# Patient Record
Sex: Male | Born: 1935 | Race: White | Hispanic: No | Marital: Married | Smoking: Former smoker
Health system: Southern US, Community
[De-identification: ages and names within clinical notes are randomized; demographics above are authoritative.]

## PROBLEM LIST (undated history)

## (undated) DIAGNOSIS — Z9861 Coronary angioplasty status: Secondary | ICD-10-CM

## (undated) DIAGNOSIS — I10 Essential (primary) hypertension: Secondary | ICD-10-CM

## (undated) DIAGNOSIS — R55 Syncope and collapse: Secondary | ICD-10-CM

## (undated) DIAGNOSIS — E785 Hyperlipidemia, unspecified: Secondary | ICD-10-CM

## (undated) DIAGNOSIS — Z8719 Personal history of other diseases of the digestive system: Secondary | ICD-10-CM

## (undated) DIAGNOSIS — I2119 ST elevation (STEMI) myocardial infarction involving other coronary artery of inferior wall: Secondary | ICD-10-CM

## (undated) DIAGNOSIS — H8309 Labyrinthitis, unspecified ear: Secondary | ICD-10-CM

## (undated) HISTORY — DX: Labyrinthitis, unspecified ear: H83.09

## (undated) HISTORY — DX: Coronary angioplasty status: Z98.61

## (undated) HISTORY — PX: CORONARY ANGIOPLASTY: SHX604

## (undated) HISTORY — DX: Syncope and collapse: R55

## (undated) HISTORY — PX: OTHER SURGICAL HISTORY: SHX169

## (undated) HISTORY — DX: Hyperlipidemia, unspecified: E78.5

## (undated) HISTORY — DX: ST elevation (STEMI) myocardial infarction involving other coronary artery of inferior wall: I21.19

## (undated) HISTORY — DX: Essential (primary) hypertension: I10

## (undated) HISTORY — PX: CORONARY ARTERY BYPASS GRAFT: SHX141

## (undated) HISTORY — DX: Personal history of other diseases of the digestive system: Z87.19

## (undated) HISTORY — PX: CARDIAC CATHETERIZATION: SHX172

---

## 2001-05-21 ENCOUNTER — Inpatient Hospital Stay (HOSPITAL_COMMUNITY): Admission: EM | Admit: 2001-05-21 | Discharge: 2001-05-23 | Payer: Self-pay | Admitting: *Deleted

## 2001-07-18 ENCOUNTER — Encounter (HOSPITAL_COMMUNITY): Admission: RE | Admit: 2001-07-18 | Discharge: 2001-10-16 | Payer: Self-pay | Admitting: Cardiology

## 2001-12-01 ENCOUNTER — Ambulatory Visit (HOSPITAL_COMMUNITY): Admission: RE | Admit: 2001-12-01 | Discharge: 2001-12-02 | Payer: Self-pay | Admitting: Cardiology

## 2001-12-01 ENCOUNTER — Encounter: Payer: Self-pay | Admitting: Cardiology

## 2004-06-30 ENCOUNTER — Ambulatory Visit (HOSPITAL_COMMUNITY): Admission: RE | Admit: 2004-06-30 | Discharge: 2004-06-30 | Payer: Self-pay | Admitting: Internal Medicine

## 2004-09-08 ENCOUNTER — Ambulatory Visit: Payer: Self-pay | Admitting: Cardiology

## 2004-11-11 ENCOUNTER — Ambulatory Visit: Payer: Self-pay | Admitting: Cardiology

## 2005-04-29 ENCOUNTER — Ambulatory Visit: Payer: Self-pay | Admitting: Internal Medicine

## 2005-05-19 ENCOUNTER — Ambulatory Visit: Payer: Self-pay | Admitting: Cardiology

## 2005-05-26 ENCOUNTER — Ambulatory Visit: Payer: Self-pay

## 2006-05-19 ENCOUNTER — Ambulatory Visit: Payer: Self-pay | Admitting: Cardiology

## 2006-06-28 ENCOUNTER — Ambulatory Visit: Payer: Self-pay | Admitting: Internal Medicine

## 2006-07-05 ENCOUNTER — Ambulatory Visit: Payer: Self-pay | Admitting: Cardiology

## 2006-07-22 ENCOUNTER — Ambulatory Visit: Payer: Self-pay | Admitting: Internal Medicine

## 2006-08-25 ENCOUNTER — Ambulatory Visit: Payer: Self-pay | Admitting: Internal Medicine

## 2006-08-25 LAB — HM COLONOSCOPY

## 2007-05-18 ENCOUNTER — Ambulatory Visit: Payer: Self-pay | Admitting: Cardiology

## 2007-05-18 LAB — CONVERTED CEMR LAB
ALT: 15 units/L (ref 0–53)
AST: 22 units/L (ref 0–37)
Albumin: 4 g/dL (ref 3.5–5.2)
Alkaline Phosphatase: 47 units/L (ref 39–117)
BUN: 11 mg/dL (ref 6–23)
Bilirubin, Direct: 0.1 mg/dL (ref 0.0–0.3)
CO2: 32 meq/L (ref 19–32)
Calcium: 9.3 mg/dL (ref 8.4–10.5)
Chloride: 101 meq/L (ref 96–112)
Cholesterol: 125 mg/dL (ref 0–200)
Creatinine, Ser: 0.8 mg/dL (ref 0.4–1.5)
GFR calc Af Amer: 122 mL/min
GFR calc non Af Amer: 101 mL/min
Glucose, Bld: 88 mg/dL (ref 70–99)
HDL: 45.7 mg/dL (ref >39.0)
LDL Cholesterol: 68 mg/dL (ref 0–99)
Potassium: 4.3 meq/L (ref 3.5–5.1)
Sodium: 138 meq/L (ref 135–145)
Total Bilirubin: 0.6 mg/dL (ref 0.3–1.2)
Total CHOL/HDL Ratio: 2.7
Total Protein: 6.5 g/dL (ref 6.0–8.3)
Triglycerides: 57 mg/dL (ref 0–149)
VLDL: 11 mg/dL (ref 0–40)

## 2007-05-24 ENCOUNTER — Ambulatory Visit: Payer: Self-pay

## 2007-08-16 ENCOUNTER — Encounter: Payer: Self-pay | Admitting: *Deleted

## 2007-08-16 DIAGNOSIS — E785 Hyperlipidemia, unspecified: Secondary | ICD-10-CM | POA: Insufficient documentation

## 2007-08-16 DIAGNOSIS — Z8719 Personal history of other diseases of the digestive system: Secondary | ICD-10-CM | POA: Insufficient documentation

## 2007-08-16 DIAGNOSIS — Z951 Presence of aortocoronary bypass graft: Secondary | ICD-10-CM | POA: Insufficient documentation

## 2007-08-16 DIAGNOSIS — Z9861 Coronary angioplasty status: Secondary | ICD-10-CM

## 2007-08-16 DIAGNOSIS — I2510 Atherosclerotic heart disease of native coronary artery without angina pectoris: Secondary | ICD-10-CM | POA: Insufficient documentation

## 2007-08-16 DIAGNOSIS — I10 Essential (primary) hypertension: Secondary | ICD-10-CM | POA: Insufficient documentation

## 2007-08-16 HISTORY — DX: Coronary angioplasty status: Z98.61

## 2007-09-22 ENCOUNTER — Ambulatory Visit: Payer: Self-pay | Admitting: Internal Medicine

## 2007-09-22 LAB — CONVERTED CEMR LAB
Calcium: 9.5 mg/dL (ref 8.4–10.5)
Chloride: 96 meq/L (ref 96–112)
GFR calc non Af Amer: 101 mL/min
PSA: 1.21 ng/mL (ref 0.10–4.00)
Sodium: 131 meq/L — ABNORMAL LOW (ref 135–145)

## 2007-09-24 ENCOUNTER — Encounter: Payer: Self-pay | Admitting: Internal Medicine

## 2008-04-29 ENCOUNTER — Ambulatory Visit: Payer: Self-pay | Admitting: Cardiology

## 2008-04-29 LAB — CONVERTED CEMR LAB
ALT: 13 units/L (ref 0–53)
Alkaline Phosphatase: 42 units/L (ref 39–117)
Bilirubin, Direct: 0.1 mg/dL (ref 0.0–0.3)
CO2: 31 meq/L (ref 19–32)
Calcium: 9.1 mg/dL (ref 8.4–10.5)
Glucose, Bld: 86 mg/dL (ref 70–99)
HDL: 47.2 mg/dL (ref >39.0)
Sodium: 140 meq/L (ref 135–145)
Total Protein: 6.6 g/dL (ref 6.0–8.3)

## 2008-05-06 ENCOUNTER — Ambulatory Visit: Payer: Self-pay | Admitting: Cardiology

## 2008-05-15 ENCOUNTER — Encounter: Payer: Self-pay | Admitting: Internal Medicine

## 2008-05-15 ENCOUNTER — Ambulatory Visit: Payer: Self-pay

## 2008-09-23 ENCOUNTER — Ambulatory Visit: Payer: Self-pay | Admitting: Internal Medicine

## 2008-09-23 LAB — CONVERTED CEMR LAB
ALT: 18 units/L (ref 0–53)
AST: 24 units/L (ref 0–37)
Alkaline Phosphatase: 41 units/L (ref 39–117)
Bilirubin, Direct: 0.1 mg/dL (ref 0.0–0.3)
CO2: 32 meq/L (ref 19–32)
Chloride: 102 meq/L (ref 96–112)
Potassium: 4.6 meq/L (ref 3.5–5.1)
Sodium: 138 meq/L (ref 135–145)
Total Bilirubin: 0.8 mg/dL (ref 0.3–1.2)
Total CHOL/HDL Ratio: 2.4
Triglycerides: 88 mg/dL (ref 0–149)

## 2008-10-01 ENCOUNTER — Encounter: Payer: Self-pay | Admitting: Internal Medicine

## 2008-10-07 ENCOUNTER — Encounter: Payer: Self-pay | Admitting: Internal Medicine

## 2008-11-18 ENCOUNTER — Encounter: Payer: Self-pay | Admitting: Internal Medicine

## 2009-01-27 ENCOUNTER — Telehealth: Payer: Self-pay | Admitting: Internal Medicine

## 2009-01-30 ENCOUNTER — Ambulatory Visit: Payer: Self-pay | Admitting: Internal Medicine

## 2009-01-30 DIAGNOSIS — M25569 Pain in unspecified knee: Secondary | ICD-10-CM | POA: Insufficient documentation

## 2009-02-20 ENCOUNTER — Encounter: Payer: Self-pay | Admitting: Internal Medicine

## 2009-03-18 ENCOUNTER — Encounter (INDEPENDENT_AMBULATORY_CARE_PROVIDER_SITE_OTHER): Payer: Self-pay | Admitting: *Deleted

## 2009-03-27 ENCOUNTER — Encounter: Payer: Self-pay | Admitting: Internal Medicine

## 2009-04-04 ENCOUNTER — Telehealth: Payer: Self-pay | Admitting: Cardiology

## 2009-06-11 ENCOUNTER — Ambulatory Visit: Payer: Self-pay | Admitting: Cardiology

## 2009-06-11 DIAGNOSIS — I2119 ST elevation (STEMI) myocardial infarction involving other coronary artery of inferior wall: Secondary | ICD-10-CM

## 2009-06-11 HISTORY — DX: ST elevation (STEMI) myocardial infarction involving other coronary artery of inferior wall: I21.19

## 2009-10-21 ENCOUNTER — Ambulatory Visit: Payer: Self-pay | Admitting: Internal Medicine

## 2009-10-21 LAB — CONVERTED CEMR LAB
Albumin: 4.7 g/dL (ref 3.5–5.2)
Basophils Absolute: 0 10*3/uL (ref 0.0–0.1)
Basophils Relative: 1 % (ref 0.0–3.0)
CO2: 29 meq/L (ref 19–32)
GFR calc non Af Amer: 100.32 mL/min (ref >60)
Glucose, Bld: 90 mg/dL (ref 70–99)
HCT: 42.9 % (ref 39.0–52.0)
HDL: 52.5 mg/dL (ref >39.00)
Hemoglobin: 14.3 g/dL (ref 13.0–17.0)
Lymphs Abs: 0.9 10*3/uL (ref 0.7–4.0)
Monocytes Relative: 10.8 % (ref 3.0–12.0)
Neutro Abs: 2.6 10*3/uL (ref 1.4–7.7)
Potassium: 4.1 meq/L (ref 3.5–5.1)
RBC: 4.51 M/uL (ref 4.22–5.81)
RDW: 12.5 % (ref 11.5–14.6)
Sodium: 136 meq/L (ref 135–145)
Total Bilirubin: 0.9 mg/dL (ref 0.3–1.2)
Total CHOL/HDL Ratio: 3
VLDL: 19.6 mg/dL (ref 0.0–40.0)

## 2009-12-16 ENCOUNTER — Ambulatory Visit: Payer: Self-pay | Admitting: Internal Medicine

## 2009-12-16 DIAGNOSIS — H8309 Labyrinthitis, unspecified ear: Secondary | ICD-10-CM | POA: Insufficient documentation

## 2010-06-17 ENCOUNTER — Ambulatory Visit: Payer: Self-pay | Admitting: Cardiology

## 2010-06-24 ENCOUNTER — Telehealth (INDEPENDENT_AMBULATORY_CARE_PROVIDER_SITE_OTHER): Payer: Self-pay | Admitting: *Deleted

## 2010-06-25 ENCOUNTER — Encounter: Payer: Self-pay | Admitting: Cardiology

## 2010-06-25 ENCOUNTER — Encounter (HOSPITAL_COMMUNITY): Admission: RE | Admit: 2010-06-25 | Discharge: 2010-07-15 | Payer: Self-pay | Admitting: Cardiology

## 2010-06-25 ENCOUNTER — Ambulatory Visit: Payer: Self-pay

## 2010-06-25 ENCOUNTER — Ambulatory Visit: Payer: Self-pay | Admitting: Cardiology

## 2010-10-13 ENCOUNTER — Encounter: Payer: Self-pay | Admitting: Internal Medicine

## 2010-10-13 ENCOUNTER — Observation Stay (HOSPITAL_COMMUNITY)
Admission: EM | Admit: 2010-10-13 | Discharge: 2010-10-14 | Payer: Self-pay | Source: Home / Self Care | Attending: Internal Medicine | Admitting: Internal Medicine

## 2010-10-22 ENCOUNTER — Ambulatory Visit: Payer: Self-pay | Admitting: Internal Medicine

## 2010-10-22 ENCOUNTER — Encounter: Payer: Self-pay | Admitting: Internal Medicine

## 2010-10-22 DIAGNOSIS — R55 Syncope and collapse: Secondary | ICD-10-CM | POA: Insufficient documentation

## 2010-11-24 NOTE — Assessment & Plan Note (Signed)
Summary: dizziness per wife/cd   Vital Signs:  Patient profile:   77 year old male Height:      57 inches Weight:      164 pounds BMI:     35.62 O2 Sat:      98 % on Room air Temp:     97.8 degrees F oral Pulse rate:   58 / minute BP supine:   132 / 68  (right arm) BP sitting:   138 / 70  (right arm) BP standing:   134 / 68  (right arm) Cuff size:   large  Vitals Entered By: Brenton Grills (December 16, 2009 2:50 PM)  O2 Flow:  Room air CC: Pt presents with dizziness x 1 week. Pt feels dizzy when getting up after laying down./aj   Primary Care Provider:  Nthony Lefferts  CC:  Pt presents with dizziness x 1 week. Pt feels dizzy when getting up after laying down./aj.  History of Present Illness: presents for increased dizziness when he gets up. He can tell when he lays down if he will be dizzy on rising. He has not fallen. the dysequilibrium will last minutes up to 30 mintues. No room spining. No diploplia, slurred speech,  cognitive trouble, weakness or other neurologic sypmtoms.   Current Medications (verified): 1)  Aspirin 325 Mg  Tabs (Aspirin) .... Take One Tablet Once Daily 2)  Crestor 20 Mg  Tabs (Rosuvastatin Calcium) .... Take One Tablet Once Daily 3)  Fish Oil 1200 Mg  Caps (Omega-3 Fatty Acids) .... Take 1 Tablet By Mouth Once A Day 4)  Mavik 2 Mg  Tabs (Trandolapril) .... Take One Tablet Once Daily 5)  Nitroglycerin 0.4 Mg Subl (Nitroglycerin) .... Place 1 Tablet Under Tongue As Directed 6)  Glucosamine 500 Mg Caps (Glucosamine Sulfate) .... Once Daily  Allergies (verified): No Known Drug Allergies PMH-FH-SH reviewed-no changes except otherwise noted  Review of Systems  The patient denies anorexia, fever, decreased hearing, hoarseness, chest pain, dyspnea on exertion, peripheral edema, headaches, abdominal pain, hematochezia, muscle weakness, difficulty walking, unusual weight change, and enlarged lymph nodes.    Physical Exam  General:   Well-developed,well-nourished,in no acute distress; alert,appropriate and cooperative throughout examination Head:  normocephalic and atraumatic.   Eyes:  vision grossly intact, pupils equal, pupils round, corneas and lenses clear, and no injection.   Neurologic:  alert & oriented X3, cranial nerves II-XII intact, gait normal, normla tandem gait. finger-to-nose normal, heel-to-shin normal, and Romberg negative.     Impression & Recommendations:  Problem # 1:  LABRYNTHITIS (ICD-386.30) Intermittent mild symptoms with a negative neuro exam are c/w labyrinthitis.  Plan - meclizine for several days on schedule then as needed.   Complete Medication List: 1)  Aspirin 325 Mg Tabs (Aspirin) .... Take one tablet once daily 2)  Crestor 20 Mg Tabs (Rosuvastatin calcium) .... Take one tablet once daily 3)  Fish Oil 1200 Mg Caps (Omega-3 fatty acids) .... Take 1 tablet by mouth once a day 4)  Mavik 2 Mg Tabs (Trandolapril) .... Take one tablet once daily 5)  Nitroglycerin 0.4 Mg Subl (Nitroglycerin) .... Place 1 tablet under tongue as directed 6)  Glucosamine 500 Mg Caps (Glucosamine sulfate) .... Once daily 7)  Medi-meclizine 25 Mg Tabs (Meclizine hcl) .... 1/2 tablet three times a day for 3 days then as needed.  Patient Instructions: 1)  dysequilibrium - seems consistent with labyrinthitis. Neuro exam is normal. symptoms are mild. Plan - meclizine 12.5 mg every 6 hours for several  days then every 6 hours as needed. This should clear. Call for increasing problems; weakness; double vision or any cognitive change.  Prescriptions: MEDI-MECLIZINE 25 MG TABS (MECLIZINE HCL) 1/2 tablet three times a day for 3 days then as needed.  #30 x 12   Entered and Authorized by:   Jacques Navy MD   Signed by:   Jacques Navy MD on 12/16/2009   Method used:   Handwritten   RxID:   [id number]

## 2010-11-24 NOTE — Progress Notes (Signed)
Summary: Nuc Pre-Procedure  Phone Note Outgoing Call Call back at [facility] Phone [phone]   Call placed by: Antionette Char RN,  June 24, 2010 3:07 PM Call placed to: Patient Reason for Call: Confirm/change Appt Summary of Call: Left message with information on Myoview Information Sheet (see scanned document for details).     Nuclear Med Background Indications for Stress Test: Evaluation for Ischemia, Graft Patency, Stent Patency   History: Angioplasty, CABG, COPD, Myocardial Infarction, Myocardial Perfusion Study  History Comments: 09 MPS Normal EF, inferior scar     Nuclear Pre-Procedure Cardiac Risk Factors: CVA, Family History - CAD, History of Smoking, Hypertension, Lipids Height (in): 57

## 2010-11-24 NOTE — Assessment & Plan Note (Signed)
Summary: F1Y/ANAS   Visit Type:  1 yr f/u Primary Spencer Best:  Spencer Best  CC:  edema/ankles at times says when he is sitting around...Marland Kitchenotherwise no other complaints today..pt weight is down 9 lb since 11/2009.  History of Present Illness: Spencer Best returns today for evaluation and management of his coronary disease. 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 He is having no angina or ischemic symptoms. His last stress Myoview was 2 years ago as listed below.  He has not had use any nitroglycerin. He denies any palpitations, orthopnea, PND or edema. He is very compliant with his medications. Lipids were reviewed from December which are at goal.    Current Medications (verified): 1)  Aspirin 325 Mg  Tabs (Aspirin) .... Take One Tablet Once Daily 2)  Crestor 20 Mg  Tabs (Rosuvastatin Calcium) .... Take One Tablet Once Daily 3)  Fish Oil 1200 Mg  Caps (Omega-3 Fatty Acids) .... Take 1 Tablet By Mouth Once A Day 4)  Mavik 2 Mg  Tabs (Trandolapril) .... Take One Tablet Once Daily 5)  Nitroglycerin 0.4 Mg Subl (Nitroglycerin) .... Place 1 Tablet Under Tongue As Directed 6)  Glucosamine 500 Mg Caps (Glucosamine Sulfate) .... Once Daily 7)  Medi-Meclizine 25 Mg Tabs (Meclizine Hcl) .... 1/2 Tablet Three Times A Day For 3 Days Then As Needed.  Allergies (verified): No Known Drug Allergies  Past History:  Past Medical History: Last updated: 06/10/2009 CORONARY ARTERY DISEASE (ICD-414.00) PERCUTANEOUS TRANSLUMINAL CORONARY ANGIOPLASTY, HX OF (ICD-V45.82) CORONARY ARTERY BYPASS GRAFT, TWO VESSEL, HX OF (ICD-V45.81) HYPERTENSION (ICD-401.9) HYPERLIPIDEMIA (ICD-272.4) KNEE PAIN, BILATERAL (ICD-719.46) SPECIAL SCREENING MALIGNANT NEOPLASM OF PROSTATE (ICD-V76.44) * L INTERNAL MAMMARY ARTERY TO L ANTEROR,SAPHENOUS VEIN GRAFT HEMORRHOIDS, HX OF (ICD-V12.79)    Past Surgical History: Last updated: 09/30/07 * L INTERNAL MAMMARY ARTERY TO L ANTEROR,SAPHENOUS VEIN GRAFT PERCUTANEOUS  TRANSLUMINAL CORONARY ANGIOPLASTY, HX OF (ICD-V45.82) CORONARY ARTERY BYPASS GRAFT, TWO VESSEL, HX OF (ICD-V45.81) corrction of abdominal birth defect at age 66  Family History: Last updated: 09/30/2007 father-died 13, CAD, RA/OA, Parkinson's mother - died 40, arrythmia, diverticulitis 1 brother - died of cancer 55, lung cancer 2 sisters- oldest 78, next 48 on multiple medications. Neg-colon, prostate cancer; DM  Social History: Last updated: 2007/09/30 married 1955 1 son 20, 1 daughter 54, 3 grandchildren working full -time on the farm, keeping dogs boxer marriage in good health  Risk Factors: Exercise: yes (2007-09-30)  Risk Factors: Smoking Status: quit (08/16/2007)  Review of Systems       negative other than history of present illness  Vital Signs:  Patient profile:   77 year old male Height:      57 inches Weight:      155 pounds BMI:     33.66 Pulse rate:   55 / minute Pulse rhythm:   irregular BP sitting:   118 / 70  (left arm) Cuff size:   large  Vitals Entered By: Danielle Rankin, CMA (June 17, 2010 10:10 AM)  Physical Exam  General:  Well developed, well nourished, in no acute distress. Head:  normocephalic and atraumatic Eyes:  PERRLA/EOM intact; conjunctiva and lids normal. Neck:  Neck supple, no JVD. No masses, thyromegaly or abnormal cervical nodes. Chest Wall:  no deformities or breast masses noted Lungs:  Clear bilaterally to auscultation and percussion. Heart:  PMI nondisplaced, normal S1-S2, regular rate and rhythm, no gallop, carotid upstrokes equal bilaterally without bruits Abdomen:  Bowel sounds positive; abdomen soft and non-tender without  masses, organomegaly, or hernias noted. No hepatosplenomegaly. Msk:  Back normal, normal gait. Muscle strength and tone normal. Pulses:  pulses normal in all 4 extremities Extremities:  No clubbing or cyanosis. Neurologic:  Alert and oriented x 3. Skin:  Intact without lesions or rashes. Psych:   Normal affect.   EKG  Procedure date:  06/17/2010  Findings:      sinus bradycardia, otherwise normal EKG.  Impression & Recommendations:  Problem # 1:  CORONARY ARTERY BYPASS GRAFT, TWO VESSEL, HX OF (ICD-V45.81) Assessment Unchanged  He remains extremely active at age 58. It's been over 2 years since his last stress test. We'll arrange an exercise Myoview. If this is negative will see him back in a year  Orders: EKG w/ Interpretation (93000) Nuclear Stress Test (Nuc Stress Test)  Problem # 2:  CORONARY ARTERY DISEASE (ICD-414.00) Assessment: Unchanged  His updated medication list for this problem includes:    Aspirin 325 Mg Tabs (Aspirin) .Marland Kitchen... Take one tablet once daily    Mavik 2 Mg Tabs (Trandolapril) .Marland Kitchen... Take one tablet once daily    Nitroglycerin 0.4 Mg Subl (Nitroglycerin) .Marland Kitchen... Place 1 tablet under tongue as directed  Orders: EKG w/ Interpretation (93000)  Problem # 3:  MYOCARDIAL INFARCTION, INFERIOR WALL, SUBSEQUENT CARE (ICD-410.42) Assessment: Unchanged  His updated medication list for this problem includes:    Aspirin 325 Mg Tabs (Aspirin) .Marland Kitchen... Take one tablet once daily    Mavik 2 Mg Tabs (Trandolapril) .Marland Kitchen... Take one tablet once daily    Nitroglycerin 0.4 Mg Subl (Nitroglycerin) .Marland Kitchen... Place 1 tablet under tongue as directed  Problem # 4:  HYPERTENSION (ICD-401.9) Assessment: Improved  His updated medication list for this problem includes:    Aspirin 325 Mg Tabs (Aspirin) .Marland Kitchen... Take one tablet once daily    Mavik 2 Mg Tabs (Trandolapril) .Marland Kitchen... Take one tablet once daily  Problem # 5:  HYPERLIPIDEMIA (ICD-272.4) Assessment: Improved  His updated medication list for this problem includes:    Crestor 20 Mg Tabs (Rosuvastatin calcium) .Marland Kitchen... Take one tablet once daily  Clinical Reports Reviewed:  Nuclear Study:  05/15/2008:  Excerise capacity: Excellent exercise capacity  Blood Pressure response: Hypertensive blood pressure response  Clinical  symptoms: No chest pain or dyspnea  ECG impression: Positive. 63m ST depression in v4-v6 at peak. Resolved in recovery.  Overall impression: Low risk stress nuclear study. Fixed defect in inferior wall from base to mid-ventricle consistent with previous infarct. ECG positive at peak but no ischemia on perfusion images. Excellent exercise capacity.  Arvilla Meres, Best  Lipid Panel: 10/21/2009:  Chol:  141   HDL:  52.50   LDL:  69    96/29/5284:  Chol:  135   HDL:  55.8   LDL:  62    04/29/2008:  Chol:  127   HDL:  47.2   LDL:  63    05/18/2007:  Chol:  125   HDL:  45.7   LDL:  68     Cardiac Cath:  12/01/2001: Cardiac Cath Findings:  CONCLUSIONS: 1. Coronary artery disease, status post prior bypass surgery and status post    diaphragmatic myocardial infarction, treated with primary angioplasty of    the right coronary artery, July 2002. 2. Severe native vessel disease with total occlusion of the left anterior    descending artery, 70% narrowing in the intermediate and posterolateral    branch of the circumflex artery, and 80% in-stent re-stenosis in the    proximal right coronary artery. 3.  Patent vein graft to the diagonal branch of the left anterior descending    and patent left internal mammary artery graft to the left anterior    descending. 4. Good left ventricular function. 5. Successful Cutting Balloon angioplasty for in-stent re-stenosis in the    proximal right coronary artery with improvement in percent diameter    narrowing from 80% to less than 20%.  DISPOSITION: The patient was returned to the postanesthesia unit for further observation.  observation. Dictated by:   Everardo Beals Juanda Chance, M.D. [facility] Attending Physician:  Lenoria Farrier DD:  12/01/01 TD:  12/02/01 Job: [id number]  05/21/2001: Cardiac Cath Findings:  ejection fraction of 55%.  CONCLUSIONS: 1. Coronary artery disease status post coronary artery bypass graft surgery in    1994, with an  acute diaphragmatic wall infarction due to total occlusion of    the right coronary artery, old total occlusion of the left anterior    descending artery, 40% narrowing in the proximal circumflex artery with 70%    narrowing in a small intermediate branch, a patent vein graft to the    diagonal branch of the left anterior descending artery with 70% stenosis    distal to the insertion site, a patent left internal mammary artery graft    to the left anterior descending artery, and inferobasal wall akinesis. 2. Successful stent deployment in the proximal right coronary artery with    improvement in the percent of diameter narrowing from 100% to 10% and    improvement in the flow from TIMI-0 to TIMI-3 flow.  There was grade 3    myocardial brush at the end of the procedure and complete resolution of    the ST segments on the monitor leads.  DISPOSITION:  The patient had the onset of chest pain at 0800 and arrived in the emergency room at 0900.  The stent was deployed establishing reperfusion at 1040.  This gave a door to balloon time of 1 hour and 40 minutes, and a reperfusion time of 2 hours and 40 minutes.  The patient will be classified as low-risk and discharged will be targeted on day #2. DD:  05/21/01 TD:  05/21/01 Job: [id number]   Patient Instructions: 1)  Your physician recommends that you schedule a follow-up appointment in: 1 year with Dr. Daleen Squibb 2)  Your physician recommends that you continue on your current medications as directed. Please refer to the Current Medication list given to you today. 3)  Your physician has requested that you have an exercise stress myoview.  For further information please visit [email or web address]  Please follow instruction sheet, as given. Prescriptions: NITROGLYCERIN 0.4 MG SUBL (NITROGLYCERIN) Place 1 tablet under tongue as directed  #25 x 9   Entered by:   Lisabeth Devoid RN   Authorized by:   Gaylord Shih, Best, [facility]   Signed by:   Lisabeth Devoid  RN on 06/17/2010   Method used:   Electronically to        [facility] (retail)       [address]       [place], [place]  [address]       Ph: [phone] or [phone]       Fax: [phone]   RxID:   [id number] MAVIK 2 MG  TABS (TRANDOLAPRIL) Take one tablet once daily  #30 Each x 11   Entered by:   Lisabeth Devoid RN   Authorized  by:   Gaylord Shih, Best, [facility]   Signed by:   Lisabeth Devoid RN on 06/17/2010   Method used:   Electronically to        [facility] (retail)       [address]       [place], [place]  [address]       Ph: [phone] or [phone]       Fax: [phone]   RxID:   [id number] CRESTOR 20 MG  TABS (ROSUVASTATIN CALCIUM) Take one tablet once daily  #30 x 11   Entered by:   Lisabeth Devoid RN   Authorized by:   Gaylord Shih, Best, [facility]   Signed by:   Lisabeth Devoid RN on 06/17/2010   Method used:   [facility]ron[facility] to        [facility] (retail)       [address]       [place], [place]  [address]       Ph: [phone] or [phone]       Fax: [phone]   RxID:   [id number]

## 2010-11-24 NOTE — Assessment & Plan Note (Signed)
Summary: Cardiology Nuclear Testing  Nuclear Med Background Indications for Stress Test: Evaluation for Ischemia, Graft Patency, PTCA Patency   History: Angioplasty, CABG, COPD, Heart Catheterization, Myocardial Infarction, Myocardial Perfusion Study  History Comments: 09 MPS Normal EF, inferior scar   Symptoms Comments: No Cardiac Symptoms   Nuclear Pre-Procedure Cardiac Risk Factors: CVA, Family History - CAD, History of Smoking, Hypertension, Lipids Caffeine/Decaff Intake: None NPO After: 9:00 PM Lungs: clear IV 0.9% NS with Angio Cath: 20g     IV Site: R Antecubital IV Started by: Irean Hong, RN Chest Size (in) 40     Height (in): 67 Weight (lb): 154 BMI: 24.21  Nuclear Med Study 1 or 2 day study:  1 day     Stress Test Type:  Stress Reading MD:  Olga Millers, MD     Referring MD:  Valera Castle, MD Resting Radionuclide:  Technetium 60m Tetrofosmin     Resting Radionuclide Dose:  11 mCi  Stress Radionuclide:  Technetium 29m Tetrofosmin     Stress Radionuclide Dose:  32.3 mCi   Stress Protocol Exercise Time (min):  11:30 min     Max HR:  131 bpm     Predicted Max HR:  145 bpm  Max Systolic BP: 221 mm Hg     Percent Max HR:  90.34 %     METS: 13.4 Rate Pressure Product:  [address]    Stress Test Technologist:  Bonnita Levan, RN     Nuclear Technologist:  Doyne Keel, CNMT  Rest Procedure  Myocardial perfusion imaging was performed at rest 45 minutes following the intravenous administration of Technetium 40m Tetrofosmin.  Stress Procedure  The patient exercised for 11:30 minutes.  The patient stopped due to leg fatigue and denied any chest pain.  There were no significant ST-T wave changes. Rare PAC and PVC noted.  Technetium 79m Tetrofosmin was injected at peak exercise and myocardial perfusion imaging was performed after a brief delay.  QPS Raw Data Images:  Acquisition technically good; normal left ventricular size. Stress Images:  There is decreased uptake in the  inferior wall Rest Images:  There is decreased uptake in the inferior wall. Subtraction (SDS):  No evidence of ischemia. Transient Ischemic Dilatation:  0.98  (Normal <1.22)  Lung/Heart Ratio:  0.23  (Normal <0.45)  Quantitative Gated Spect Images QGS EDV:  107 ml QGS ESV:  50 ml QGS EF:  53 % QGS cine images:  Normal wall motion.   Overall Impression  Exercise Capacity: Excellent exercise capacity. BP Response: Hypertensive blood pressure response. Clinical Symptoms: No chest pain ECG Impression: No significant ST segment change suggestive of ischemia. Overall Impression: Abnormal stress nuclear study with prior inferior infarct; no ischemia; probable right ventricular enlargement.  Appended Document: Cardiology Nuclear Testing stable, no change in treatment  Appended Document: Cardiology Nuclear Testing [facility] Mylo Red RN  Appended Document: Cardiology Nuclear Testing Pt aware of results. Copy sent to patient. Mylo Red RN

## 2010-11-26 NOTE — Assessment & Plan Note (Signed)
Summary: YEARLY FU/ MEDICARE/ LABS LATER/NWS  #   Vital Signs:  Patient profile:   77 year old male Height:      67 inches Weight:      161 pounds BMI:     25.31 O2 Sat:      99 % on Room air Temp:     97.3 degrees F oral Pulse rate:   51 / minute BP sitting:   130 / 66  (left arm) Cuff size:   regular  Vitals Entered By: Bill Salinas CMA (October 22, 2010 1:40 PM)  O2 Flow:  Room air CC: yearly/ ab   Primary Care Provider:  Jacques Navy MD  CC:  yearly/ ab.  History of Present Illness: Patient presents for hospital follow-up: he was admitted Dec 20-21 for syncope. Hospital records, labs reviewed and called for 2 D echo results: His evaluation was negative: nl MRI, nl Tele except for bradycardia and 1st degree AVB, nl 2D echo except for grade diastolic dysfunction. Labs were normal including lipid panel. Final diagnosis was micturition syncope.  In the interval since d/c he has done well with no recurrent symptoms. He does have mild positional veritigol symptoms with major position change.  Preventive Screening-Counseling & Management  Alcohol-Tobacco     Alcohol drinks/day: 0     Smoking Status: quit  Caffeine-Diet-Exercise     Caffeine use/day: none     Does Patient Exercise: yes     Type of exercise: cardio     Exercise (avg: min/session): <30     Times/week: <3  Hep-HIV-STD-Contraception     Dental Visit-last 6 months yes     Sun Exposure-Excessive: no  Safety-Violence-Falls     Seat Belt Use: yes     Helmet Use: n/a     Firearms in the Home: firearms in the home     Smoke Detectors: yes     Violence in the Home: no risk noted     Sexual Abuse: no     Fall Risk: low fall risk      Blood Transfusions:  no.    Current Medications (verified): 1)  Aspirin 325 Mg  Tabs (Aspirin) .... Take One Tablet Once Daily 2)  Crestor 20 Mg  Tabs (Rosuvastatin Calcium) .... Take One Tablet Once Daily 3)  Fish Oil 1200 Mg  Caps (Omega-3 Fatty Acids) .... Take 1  Tablet By Mouth Once A Day 4)  Mavik 2 Mg  Tabs (Trandolapril) .... Take One Tablet Once Daily 5)  Nitroglycerin 0.4 Mg Subl (Nitroglycerin) .... Place 1 Tablet Under Tongue As Directed 6)  Glucosamine 500 Mg Caps (Glucosamine Sulfate) .... Once Daily 7)  Medi-Meclizine 25 Mg Tabs (Meclizine Hcl) .... 1/2 Tablet Three Times A Day For 3 Days Then As Needed.  Allergies (verified): No Known Drug Allergies  Past History:  Past Medical History: Last updated: 06/10/2009 CORONARY ARTERY DISEASE (ICD-414.00) PERCUTANEOUS TRANSLUMINAL CORONARY ANGIOPLASTY, HX OF (ICD-V45.82) CORONARY ARTERY BYPASS GRAFT, TWO VESSEL, HX OF (ICD-V45.81) HYPERTENSION (ICD-401.9) HYPERLIPIDEMIA (ICD-272.4) KNEE PAIN, BILATERAL (ICD-719.46) SPECIAL SCREENING MALIGNANT NEOPLASM OF PROSTATE (ICD-V76.44) * L INTERNAL MAMMARY ARTERY TO L ANTEROR,SAPHENOUS VEIN GRAFT HEMORRHOIDS, HX OF (ICD-V12.79)    Past Surgical History: Last updated: 2007/10/02 * L INTERNAL MAMMARY ARTERY TO L ANTEROR,SAPHENOUS VEIN GRAFT PERCUTANEOUS TRANSLUMINAL CORONARY ANGIOPLASTY, HX OF (ICD-V45.82) CORONARY ARTERY BYPASS GRAFT, TWO VESSEL, HX OF (ICD-V45.81) corrction of abdominal birth defect at age 89  Family History: Last updated: 10-02-07 father-died 64, CAD, RA/OA, Parkinson's mother - died 78,  arrythmia, diverticulitis 1 brother - died of cancer 30, lung cancer 2 sisters- oldest 58, next 4 on multiple medications. Neg-colon, prostate cancer; DM  Social History: Last updated: 09/22/2007 married 1955 1 son 82, 1 daughter 2, 3 grandchildren working full -time on the farm, keeping dogs boxer marriage in good health  Social History: Caffeine use/day:  none Dental Care w/in 6 mos.:  yes Sun Exposure-Excessive:  no Risk analyst Use:  yes Fall Risk:  low fall risk Blood Transfusions:  no  Review of Systems       The patient complains of syncope.  The patient denies anorexia, fever, weight loss, weight gain, vision  loss, decreased hearing, chest pain, dyspnea on exertion, prolonged cough, headaches, abdominal pain, severe indigestion/heartburn, incontinence, muscle weakness, difficulty walking, depression, and enlarged lymph nodes.    Physical Exam  General:  Well-developed,well-nourished,in no acute distress; alert,appropriate and cooperative throughout examination Eyes:  vision grossly intact, pupils equal, and pupils round.   Neck:  supple.   Lungs:  normal respiratory effort.   Heart:  regular rhythm, no murmur, no rub, no HJR, and bradycardia.   Neurologic:  alert & oriented X3 and gait normal.   Skin:  turgor normal and color normal.   Psych:  Oriented X3, memory intact for recent and remote, normally interactive, and good eye contact.     Impression & Recommendations:  Problem # 1:  SYNCOPE, VASOVAGAL (ICD-780.2) No recurrent symptoms. Reviewed all data. Explained to patient the mechanism of vaso-vagal/micturition syncope. No further w/u or treatment needed.  He has had mild positional vertigo. 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 No need for further evaluation or treatment.  Problem # 2:  CORONARY ARTERY DISEASE (ICD-414.00)  Very stable . At last admission cardiac enzymes cycled x 3 - normal. He does have bradycardia.  Plan - continue presdent meds.           12 lead EKG with sinus bradycardia and P-R interval by calipers and 196 msec by machine read: no AVB.  His updated medication list for this problem includes:    Aspirin 325 Mg Tabs (Aspirin) .Marland Kitchen... Take one tablet once daily    Mavik 2 Mg Tabs (Trandolapril) .Marland Kitchen... Take one tablet once daily    Nitroglycerin 0.4 Mg Subl (Nitroglycerin) .Marland Kitchen... Place 1 tablet under tongue as directed  Orders: EKG w/ Interpretation (93000)  Problem # 3:  HYPERTENSION (ICD-401.9)  His updated medication list for this problem includes:    Mavik 2 Mg Tabs (Trandolapril) .Marland Kitchen... Take one tablet once daily  BP today:  130/66 Prior BP: 118/70 (06/17/2010)  Recent hospital labs normal. Control is adequate.  Plan - continue present meds.  Problem # 4:  HYPERLIPIDEMIA (ICD-272.4) Patient had labs as inpatient with excellent control with LDL at goal and normal liver functions.  His updated medication list for this problem includes:    Crestor 20 Mg Tabs (Rosuvastatin calcium) .Marland Kitchen... Take one tablet once daily  Problem # 5:  Preventive Health Care (ICD-V70.0) Todays exam and recent hospital eval and labs normal. He is current with colorectal cancer screening with last colonoscopy  Nov '07 - due for follow up 2017. Immunizations: Tetnus Dec '10; Pneumonia vaccine Nov '08; shingles Dec '10; flu Nov '10 and probably in hospital Dec '11. 12 Lead EKG with sinus bradycardia, no evidence of AVB or ishcemia.  In summary - medically stable man with complete neuro eval and other labs in hospital dec '11. He will return for routine care  in 1 year or return otherwise as needed.   provided copy of hospital labs, d/c summary and EKG.  Complete Medication List: 1)  Aspirin 325 Mg Tabs (Aspirin) .... Take one tablet once daily 2)  Crestor 20 Mg Tabs (Rosuvastatin calcium) .... Take one tablet once daily 3)  Fish Oil 1200 Mg Caps (Omega-3 fatty acids) .... Take 1 tablet by mouth once a day 4)  Mavik 2 Mg Tabs (Trandolapril) .... Take one tablet once daily 5)  Nitroglycerin 0.4 Mg Subl (Nitroglycerin) .... Place 1 tablet under tongue as directed 6)  Glucosamine 500 Mg Caps (Glucosamine sulfate) .... Once daily 7)  Medi-meclizine 25 Mg Tabs (Meclizine hcl) .... 1/2 tablet three times a day for 3 days then as needed.   Orders Added: 1)  Est. Patient Level IV [91478] 2)  EKG w/ Interpretation [93000]

## 2011-01-04 LAB — URINE CULTURE
Colony Count: NO GROWTH
Culture  Setup Time: 201112201358

## 2011-01-04 LAB — CBC
Hemoglobin: 13.2 g/dL (ref 13.0–17.0)
MCH: 30.9 pg (ref 26.0–34.0)
MCHC: 33.9 g/dL (ref 30.0–36.0)
Platelets: 163 10*3/uL (ref 150–400)
RDW: 12.8 % (ref 11.5–15.5)
WBC: 5.9 10*3/uL (ref 4.0–10.5)

## 2011-01-04 LAB — DIFFERENTIAL
Basophils Relative: 1 % (ref 0–1)
Eosinophils Absolute: 0.2 10*3/uL (ref 0.0–0.7)
Monocytes Relative: 11 % (ref 3–12)
Neutrophils Relative %: 61 % (ref 43–77)

## 2011-01-04 LAB — URINALYSIS, ROUTINE W REFLEX MICROSCOPIC
Glucose, UA: NEGATIVE mg/dL
pH: 6.5 (ref 5.0–8.0)

## 2011-01-04 LAB — COMPREHENSIVE METABOLIC PANEL
ALT: 14 U/L (ref 0–53)
Albumin: 3.6 g/dL (ref 3.5–5.2)
Alkaline Phosphatase: 35 U/L — ABNORMAL LOW (ref 39–117)
GFR calc Af Amer: >60 mL/min (ref >60)
Potassium: 4.3 mEq/L (ref 3.5–5.1)
Sodium: 135 mEq/L (ref 135–145)
Total Protein: 6.2 g/dL (ref 6.0–8.3)

## 2011-01-04 LAB — CK TOTAL AND CKMB (NOT AT ARMC)
CK, MB: 1.3 ng/mL (ref 0.3–4.0)
Total CK: 86 U/L (ref 7–232)

## 2011-01-04 LAB — BASIC METABOLIC PANEL
BUN: 11 mg/dL (ref 6–23)
CO2: 30 mEq/L (ref 19–32)
Calcium: 9.1 mg/dL (ref 8.4–10.5)
Creatinine, Ser: 0.91 mg/dL (ref 0.4–1.5)
Glucose, Bld: 96 mg/dL (ref 70–99)
Sodium: 134 mEq/L — ABNORMAL LOW (ref 135–145)

## 2011-01-04 LAB — CARDIAC PANEL(CRET KIN+CKTOT+MB+TROPI)
CK, MB: 0.9 ng/mL (ref 0.3–4.0)
Relative Index: INVALID (ref 0.0–2.5)
Relative Index: INVALID (ref 0.0–2.5)
Total CK: 77 U/L (ref 7–232)
Troponin I: 0.01 ng/mL (ref 0.00–0.06)

## 2011-01-04 LAB — LIPID PANEL
HDL: 47 mg/dL (ref >39)
Total CHOL/HDL Ratio: 2.5 RATIO
VLDL: 11 mg/dL (ref 0–40)

## 2011-01-04 LAB — POCT CARDIAC MARKERS
CKMB, poc: <1 ng/mL — ABNORMAL LOW (ref 1.0–8.0)
Troponin i, poc: <0.05 ng/mL (ref 0.00–0.09)

## 2011-01-04 LAB — PROTIME-INR: INR: 1.02 (ref 0.00–1.49)

## 2011-03-09 NOTE — Assessment & Plan Note (Signed)
[facility]                            CARDIOLOGY OFFICE NOTE   NAME:Spencer Best, Spencer Best                       MRN:          [id number]  DATE:05/06/2008                            DOB:          [birth year]    Mr. Levitt returns today for further management of his coronary artery  disease and hyperlipidemia.   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  He is status post coronary artery bypass grafting  with a vein graft to a diagonal and left internal mammary graft to the  LAD in 1994 by Dr. Sheliah Plane.  His last catheterization was in  2004 which showed patent grafts, normal LV function.  He has also had  his right coronary artery stented.  He is having no symptoms of angina.  His last stress Myoview showed excellent exercise tolerance, EF of 60%,  some ST-segment changes laterally, some scar in the inferior basal wall  but no significant ischemia.   His hyperlipidemia has been treated aggressively with Crestor 20 mg a  day as well as flaxseed oil and fish oil.  His total cholesterol is 127,  triglycerides 84, HDL 47.2, and LDL 63.  LFTs were normal.   He denies any angina, ischemic symptoms, dyspnea on exertion, orthopnea,  or PND.   CURRENT MEDICATIONS:  1. Enteric-coated aspirin 325 mg a day.  2. Crestor 20 mg a day.  3. Fish oil.  4. Lisinopril 20 mg a day.  5. Flaxseed oil 1200 mg a day.   PHYSICAL EXAMINATION:  VITAL SIGNS:  His blood pressure today is 137/61,  his pulse is 65 and regular, and his weight is 158.  HEENT:  Unchanged.  NECK:  Carotids upstrokes are equal bilaterally without bruits.  No JVD.  Thyroid is not enlarged.  Trachea is midline.  LUNGS:  Clear to auscultation.  HEART:  Nondisplaced PMI.  He has a soft systolic murmur along left  sternal border.  He has an S4.  ABDOMINAL:  Soft, good bowel sounds.  No midline bruit.  No  hepatomegaly.  No organomegaly.  EXTREMITIES:   No cyanosis, clubbing, or edema.  Pulses are 2+/4+  bilaterally and symmetrical.  NEURO:  Grossly intact.  SKIN:  Unremarkable.   His electrocardiogram shows normal sinus rhythm with poor progression in  the anterior precordium which is stable.  He does not have any  suggestion of any inferior wall infarct on his EKG nor did I see any of  these changes on his catheterization in 2004.   I am delighted Mr. Geiman is doing so well.  With the age of his grafts,  I would like to repeat an exercise rest/stress Myoview.  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   Assuming this is stable, I have made no changes in his medical program.  I will see him back in a year.     Thomas C. Daleen Squibb, MD, [facility]  Electronically Signed    TCW/MedQ  DD: 05/06/2008  DT: 05/07/2008  Job #: [id number]

## 2011-03-09 NOTE — Assessment & Plan Note (Signed)
[facility]                            CARDIOLOGY OFFICE NOTE   NAME:Spencer Best, Spencer Best                       MRN:          [id number]  DATE:05/18/2007                            DOB:          1933/04/05    1. Spencer Best returns today for further management of his coronary      artery disease.  He had bypass surgery in 1994.  He is having no      symptoms of ischemia.  He had a negative stress Myoview in 2006.      He is due followup.  There was some trivial inferior wall ischemia.      We are treating him medically.  He has normal left ventricular      systolic function.  2. Hyperlipidemia.  Lipids were at goal last year. He is due followup.      He is now off Zetia and just taking Crestor.  3. Hypertension.   He denies any symptoms of ischemia or angina.  He seems to be doing well  in general.   MEDICATIONS:  1. Mavik 2 mg daily.  2. Fish oil 1200 mg b.i.d.  3. Crestor 20 mg daily.  4. Enteric coated aspirin 325 mg daily.   PHYSICAL EXAMINATION:  He is in no acute distress.  His blood pressure is 120/62, pulse 60 and regular.  Weight is 154.  HEENT:  Normocephalic.  Atraumatic.  PERRLA, extraocular movements  intact. Sclera clear.  Facial symmetry is normal.  Carotids are full.  There are no bruits.  Thyroid is not enlarged.  Trachea is midline.  LUNGS:  Clear.  HEART:  Reveals a nondisplaced PMI with normal S1, S2.  ABDOMEN:  Soft, good bowel sounds.  No midline bruit.  EXTREMITIES:  No cyanosis, clubbing or edema. Pulses are brisk.  NEURO:  Exam is intact.   EKG:  Showed sinus brady, otherwise normal.   ASSESSMENT:  [name] is doing well.  His grafts are now 75 years old.   PLAN:  1. Exercise rest stress Myoview.  2. Continue current medications.  3. Fasting lipids and comprehensive metabolic panel.   If things are stable, I will see him back in a year.     Thomas C. Daleen Squibb, MD, [facility]  Electronically Signed    TCW/MedQ  DD:  05/18/2007  DT: 05/18/2007  Job #: [id number]

## 2011-03-12 NOTE — Cardiovascular Report (Signed)
. [facility]  Patient:    Spencer Best, Spencer Best Visit Number: [id number] MRN: [id number]          Service Type: CAT Location: [id number] Attending Physician:  Lenoria Farrier Dictated by:   Everardo Beals Juanda Chance, M.D. [facility] Proc. Date: 12/01/01 Admit Date:  12/01/2001   CC:         Rosalyn Gess. Norins, M.D. [facility]  Maisie Fus C. Wall, M.D. [facility]  Cardiopulmonary Laboratory   Cardiac Catheterization  PROCEDURES PERFORMED: Cardiac catheterization and percutaneous coronary intervention.  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 He has had previous bypass surgery. Six months ago, he had a DMI due to total occlusion of his native right coronary artery which was treated with stenting. He had a six-month followup Cardiolite scan which suggested inferior ischemia, and was seen by Dr. Daleen Squibb and scheduled for evaluation with angiography.  DESCRIPTION OF PROCEDURE: The procedure was performed via the right femoral artery using an arterial sheath and 6 French preformed coronary catheters.  A front wall arterial puncture was performed and Omnipaque contrast was used. A LIMA catheter was used for injection of the LIMA graft. A JR4 catheter was used for injection of the vein graft to the diagonal branch and the LAD. After completion of the diagnostic studies, we made a decision to proceed with intervention on the right coronary artery.  The patient was given weight-adjusted heparin to prolong the ACT to greater than 200 seconds and was given double bolus of Integrilin and infusion. We used a JR4 7 [place] guiding catheter and a short luge wire. We crossed the lesion in the proximal right coronary artery with the wire without difficulty. We initially went in with a 3.25 Cutting Balloon and performed a total of four inflations up to 8 atmospheres for 35 seconds. We then upgraded to a 3.5  x 15 mm Cutting Balloon and performed a total of 5 inflations up to 11 atmospheres for 35 seconds. Repeat diagnostic studies were then performed through the guiding catheter. The patient tolerated the procedure well and left the laboratory in satisfactory condition.  RESULTS: The left main coronary artery: The left main coronary artery was free of significant disease.  Left anterior descending: The left anterior descending artery was completely occluded after the small diagonal branch.  Circumflex artery: The circumflex artery gave rise to an intermediate branch and two posterolateral branches. There was 70% narrowing in the intermediate branch and there was 70% narrowing in the opposite portion of the one of the posterolateral branches.  Right coronary artery: The right coronary was a large dominant vessel that gave rise to a conus branch, three right ventricular branches, a posterior descending branch and a posterolateral branch. There was 80% narrowing in the proximal vessel within the stent and at both edges of the stent.  The saphenous vein graft to the diagonal branch and LAD was patent and functioned normally.  The LIMA graft to the LAD was patent and functioned normally.  LEFT VENTRICULOGRAPHY: The left ventriculogram performed in the RAO projection showed showed good wall motion with no areas or hypokinesis.  Following Cutting Balloon angioplasty of the in-stent re-stenosis in the proximal right coronary artery, the stenosis improved from 80% to less than 20%. There was no dissection seen.  The aortic pressure is 143/59 with a mean of 86 and left ventricular pressure was 143/19.  CONCLUSIONS: 1. Coronary artery  disease, status post prior bypass surgery and status post    diaphragmatic myocardial infarction, treated with primary angioplasty of    the right coronary artery, July 2002. 2. Severe native vessel disease with total occlusion of the left anterior     descending artery, 70% narrowing in the intermediate and posterolateral    branch of the circumflex artery, and 80% in-stent re-stenosis in the    proximal right coronary artery. 3. Patent vein graft to the diagonal branch of the left anterior descending    and patent left internal mammary artery graft to the left anterior    descending. 4. Good left ventricular function. 5. Successful Cutting Balloon angioplasty for in-stent re-stenosis in the    proximal right coronary artery with improvement in percent diameter    narrowing from 80% to less than 20%.  DISPOSITION: The patient was returned to the postanesthesia unit for further observation.    observation. Dictated by:   Everardo Beals Juanda Chance, M.D. [facility] Attending Physician:  Lenoria Farrier DD:  12/01/01 TD:  12/02/01 Job: [id number]

## 2011-03-12 NOTE — Discharge Summary (Signed)
[place]  Patient:    Spencer Best, Spencer Best                       MRN: [id number] Adm. Date:  [id number] Disc. Date: [id number] Attending:  Junious Silk Dictator:   Abelino Derrick, P.A.C. [facility]                           Discharge Summary  DISCHARGE DIAGNOSIS: 1. Acute DMI treated with RCA stenting, no angiojet. 2. Coronary artery bypass grafting in 1994, patent grafts at time of    catheterization this admission with an ejection fraction of 55%. 3. Treated hyperlipidemia.  HOSPITAL COURSE:  The patient is a 77 year old male who had bypass surgery in 1994 and presented with an acute inferior MI.  He was taken emergently to the catheterization lab by Dr. Juanda Chance.  Catheterization revealed a total RCA that was dilated and stented.  The LIMA to the LAD was patent, the SVG to the diagonal was patent, there was a 70% narrowing distally in the diagonal.  EF was 55%.  Reperfusion time was 2 hours and 40 minutes.  The patient was monitorED in CCU.  He did have some bradycardia and some idioventricular and nonsustained ventricular tachycardia early on but this cleared up by the time he was discharged.  His blood pressure is somewhat borderline at 102 systolic and he was now put on an ACE inhibitor.  DISCHARGE MEDICATIONS: 1. Coated aspirin once a day. 2. Pravachol 40 mg a day. 3. Plavix 75 mg a day for 4 weeks. 4. Nitroglycerin sublingual p.r.n.  LABORATORY DATA:  Cholesterol 168, LDL 90, HDL 47.  Liver function shows sodium 136, potassium 4.3, BUN 7, creatinine 0.8, white count 7.1, hemoglobin 12.3, hematocrit 35.8, platelets 184, INR is 1.1.  CK is peaked at 799, 156 MBs.  Liver functions are normal.  Chest x-ray shows borderline cardiomegaly, no active disease.  EKG shows sinus rhythm and sinus bradycardia.  Inferior Qs.  DISPOSITION:  The patient is discharged in stable condition and will followup with Dr. Mariella Saa., June 06, 2001, at 3  p.m. DD:  05/23/01 TD:  05/23/01 Job: [id number]

## 2011-03-12 NOTE — Assessment & Plan Note (Signed)
[facility]                              CARDIOLOGY OFFICE NOTE   NAME:Best, Spencer ANGELINO                       MRN:          [id number]  DATE:05/19/2006                            DOB:          Jun 27, 1933   REASON FOR EVALUATION:  1.  Spencer Best returns today for further management of his coronary artery      disease.  He had coronary artery bypass surgery in 1994.  He is having      no symptoms of ischemia.  He had a negative stress Myoview last year      except for some minimal inferior wall ischemia.  He has normal left      ventricular systolic function.  2.  Hyperlipidemia.  3.  Hypertension.   He is due lipids and a comprehensive metabolic panel today.  He is fasting.   MEDICATIONS:  1.  Lisinopril 10 mg a day.  2.  Fish Oil 1200 mg p.o. b.i.d.  3.  Crestor 20 mg a day.  4.  Zetia 10 mg a day.  5.  Aspirin 325 a day.   EXAMINATION:  VITAL SIGNS:  His blood pressure is 130/70.  His pulse 52 and  regular.  His weight is 154.  SKIN:  Color is good.  GENERAL:  He is in no acute distress.  NECK:  Carotid upstrokes are equal bilaterally without bruits. No JVD.  Thyroid is not enlarged.  Trachea is midline.  LUNGS:  Clear.  HEART:  Reveals a soft S1, S2 without murmur, rub, or gallop.  ABDOMEN:  Soft, no midline bruits, no hepatomegaly. Bowel sounds are  present.  EXTREMITIES:  Without cyanosis, clubbing or edema. Pulses are brisk.   Spencer Best is doing remarkable well.  His EKG shows sinus bradycardia with  no ST segment changes.   PLAN:  1.  Check CMP and fasting lipids.  2.  Prescription for Zetia written to the [facility] in hopes      of getting some samples there.  3.  No changes in his medical program.  4.  I plan on seeing him back in a year.  At that time, he will need a      stress Myoview.                               Thomas C. Daleen Squibb, MD, [facility]  TCW/MedQ  DD:  05/19/2006  DT:  05/19/2006  Job #:  [id number]

## 2011-03-12 NOTE — Cardiovascular Report (Signed)
[place]. [facility]  Patient:    Spencer Best, Spencer Best                         MRN: [id number] Proc. Date: 05/21/01 Adm. Date:  05/21/01 Attending:  Everardo Beals. Juanda Chance, M.D. [facility] CC:         Rosalyn Gess. Norins, M.D. [facility]  Maisie Fus C. Daleen Squibb, M.D. [facility]  Arlys John S. Jens Som, M.D. [facility]  Cardiac Catheterization Laboratory   Cardiac Catheterization  CLINICAL HISTORY:  [name] is 77 years old and had bypass surgery performed in 1994, by Dr. Tyrone Sage.  We do not have the records, but we believe he had a vein graft to the diagonal branch of the LAD and a LIMA to the LAD.  He developed the onset of chest pain at 8:00 this morning and presented to the emergency room at 9 a.m. with EKG changes and acute diaphragmatic and posterior wall infarction.  He was seen by Dr. Jens Som and arrangements were made for him to come emergently to the catheterization laboratory.  PROCEDURE:  The procedure was performed via the right femoral artery using arterial sheath and 6-French preformed coronary catheters.  A front wall arterial puncture was performed and Omnipaque contrast was used.  A temporary pacemaker was passed via the right femoral vein to the right ventricle because of marked sinus bradycardia at 40.  After completion of the diagnostic study, we made the decision to proceed with intervention on the right coronary artery.  The patient had been consented and enrolled in the AIMI trial and was randomized to no angiojet.  He was given weight-adjusted heparin to prolong the ACT to greater than 200 seconds and was given double bolus Integrilin and infusion.  All of this was not started until after the stent deployment.  We used a 7-French JR-4 guiding catheter with side-holes and a luge wire.  We crossed the lesion with the wire without difficulty, which established reperfusion.  We then direct stented with a 3.0 x 18 mm NIR stent, deploying this with one inflation of 12 atm for 30  seconds.  We then postdilated with a 3.5 x 12 mm Quantum Ranger, performing one inflation of 14 atm for 46 seconds. Repeat diagnostic study was then performed through the guiding catheter.  We injected the LIMA graft with a LIMA catheter and this was done after the intervention.  We used a JR-4 catheter for injection of the vein graft.  RESULTS:  The aortic pressure was 125/62, with a mean of 87.  The left ventricular pressure was 125/15.  The left main coronary artery was free of significant disease.  The left anterior descending artery was completely occluded in its origin.  The circumflex artery gave rise to a small intermediate branch, an atrial branch, and two posterolateral branches.  There was 40% ostial narrowing in the circumflex artery.  There was 70% ostial narrowing in the intermediate branch.  There was 40% narrowing in the distal circumflex artery.  The right coronary artery was completely occluded proximally.  There were no collaterals seen.  The saphenous vein graft to the diagonal branch of the LAD was patent and functioned normally and also filled the LAD, although there was competing flow from the mammary graft in the LAD.  There was 70% narrowing in the diagonal branch after the insertion site.  The LIMA graft to the LAD was patent and functioned normally.  This was done after the intervention.  There was  an 80% proximal to the LIMA insertion site. There was balanced flow in the LIMA.  Following stenting of the proximal right coronary artery, the stenosis improved from 100% to 10% and the flow improved from TIMI-0 to TIMI-3 flow. There was grade 3 myocardial brush at the end of the procedure.  The left ventriculogram performed in the RAO projection showed inferior basal wall akinesis.  The overall wall motion was good with an estimated ejection fraction of 55%.  CONCLUSIONS: 1. Coronary artery disease status post coronary artery bypass graft surgery in     1994, with an acute diaphragmatic wall infarction due to total occlusion of    the right coronary artery, old total occlusion of the left anterior    descending artery, 40% narrowing in the proximal circumflex artery with 70%    narrowing in a small intermediate branch, a patent vein graft to the    diagonal branch of the left anterior descending artery with 70% stenosis    distal to the insertion site, a patent left internal mammary artery graft    to the left anterior descending artery, and inferobasal wall akinesis. 2. Successful stent deployment in the proximal right coronary artery with    improvement in the percent of diameter narrowing from 100% to 10% and    improvement in the flow from TIMI-0 to TIMI-3 flow.  There was grade 3    myocardial brush at the end of the procedure and complete resolution of    the ST segments on the monitor leads.  DISPOSITION:  The patient had the onset of chest pain at 0800 and arrived in the emergency room at 0900.  The stent was deployed establishing reperfusion at 1040.  This gave a door to balloon time of 1 hour and 40 minutes, and a reperfusion time of 2 hours and 40 minutes.  The patient will be classified as low-risk and discharged will be targeted on day #2. DD:  05/21/01 TD:  05/21/01 Job: [id number]

## 2011-03-12 NOTE — Discharge Summary (Signed)
[place]. [facility]  Patient:    Spencer Best, Spencer Best Visit Number: [id number] MRN: [id number]          Service Type: CAT Location: [id number] Attending Physician:  Lenoria Farrier Dictated by:   Tereso Newcomer, P.A. Admit Date:  12/01/2001 Disc. Date: 12/02/01   CC:         Rosalyn Gess. Norins, M.D. [facility]   Discharge Summary  DATE OF BIRTH:  03/24/1933  DISCHARGE DIAGNOSES: 1. Coronary artery disease, status post coronary artery bypass graft. 2. Cardiac catheterization this admission revealing left anterior descending    total, circumflex 70% intermediate, 70% posterolateral, right coronary    artery 80% in-stent restenosis, saphenous vein graft to diagonal okay, left    internal mammary artery to left anterior descending okay. 3. Percutaneous transluminal coronary angioplasty to the mid right coronary    artery in-stent restenosis this admission with reduction in stenosis of 80%    to less than 20% by Dr. Charlies Constable on December 01, 2001. 4. Preserved left ventricular function with ejection fraction of 60%. 5. Hyperlipidemia.  HISTORY OF PRESENT ILLNESS:  This 77 year old male was seen in followup on November 21, 2001.  At that time, he denied any further angina.  He did have a followup stress Cardiolite on November 09, 2001, that showed an EF of 50% and mild inferior basilar ischemia.  Therefore, he was set up for cardiac catheterization to evaluate his abnormal Cardiolite.  HOSPITAL COURSE:  He went to [facility] area on December 01, 2001.  He underwent cardiac catheterization by Dr. Charlies Constable.  The results are noted above.  He further underwent percutaneous transluminal coronary angioplasty to the mid RCA and in-stent restenosis with reduction of stenosis of 80% to less than 20%.  He tolerated the procedure well and had no immediate complications.  On the morning of December 02, 2001, he was found to be in stable  condition without chest pain or right groin pain.  On exam, he did have a scant ooze from his groin site, but no hematoma or bruit.  His postprocedure CK-MB was negative.  His EKG was without significant change.  Therefore, he was felt stable enough for discharge to home.  He would continue on his same medications and followup with Dr. Daleen Squibb in one to two weeks.  LABORATORY DATA AND X-RAY FINDINGS:  Sodium 134, potassium 4.5, chloride 100, CO2 29, glucose 94, BUN 8, creatinine 0.8.  Hemoglobin 13, hematocrit 37, platelet count 174,000, white blood cell count 5400.  INR 1.2.  DISCHARGE MEDICATIONS: 1. Coated aspirin 325 mg q.d. 2. Pravachol 40 mg q.h.s. 3. Mavik 2 mg q.d. 4. Foltx q.d. 5. Colace p.r.n. 6. Nitroglycerin p.r.n. chest pain.  ACTIVITY:  No driving, heavy lifting or exertional work x3 days.  DIET:  Low fat, low sodium diet.  SPECIAL INSTRUCTIONS:  He is to call our office for any groin swelling, bleeding or bruising.  FOLLOWUP:  He will follow up with Dr. Daleen Squibb on February 17, at 10 a.m. Dictated by:   Tereso Newcomer, P.A. Attending Physician:  Lenoria Farrier DD:  12/02/01 TD:  12/02/01 Job: [id number]

## 2011-03-12 NOTE — Assessment & Plan Note (Signed)
[facility]                             PRIMARY CARE OFFICE NOTE   NAME:Spencer Best, Spencer Best                       MRN:          [id number]  DATE:06/28/2006                            DOB:          05/04/1933    Spencer Best is a delightful 77 year old gentleman who is followed for  multiple medical problems including known CAD, status post bypass surgery in  1994 with LIMA __________, SVG to diagonal.  The patient had an MI in July  of 2002 and had PTCA of the RCA, and he had InStent restenosis requiring a  PTCA of the RCA in February of 2003.  He is also followed for hemorrhoids  and hypertension.   The patient last saw cardiology on May 19, 2006, and Dr. Daleen Best felt at that  time that he was very stable and doing well.  I last saw the patient in July  of 2006.   PAST MEDICAL HISTORY:  As above.  In addition, the patient had removal of a  tumor from his abdomen at age 32.   MEDICAL ILLNESSES:  As above, with the addition of usual childhood diseases.   SOCIAL HISTORY:  The patient is retired and lives in [place].  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  Remains very active.   CURRENT MEDICATIONS:  1. Aspirin 325 mg daily.  2. Crestor 20 mg daily.  3. Fish oil daily.  4. Lisinopril 10 mg daily.  5. Mavik 2 mg daily.  6. Flax oil daily.   CHART REVIEW:  Last colorectal exam November 23, 2000 with flexible  sigmoidoscopy.  Last stress Myoview study is from May 26, 2005.  I  reviewed old inferobasal Best infarction, inferior Best ischemia at the apex  and mid level, gaited ejection fraction of 55%; this was thought to be  stable and is being followed conservatively.   REVIEW OF SYSTEMS:  The patient has had no fevers, sweats, or chills.  He  does have mild sleep duration problems.  It has been more than 12 months  since an eye exam.  No ENT, cardiovascular or respiratory complaints.  GI:  The patient does use a __________ with good  results.  GU:  The patient has  nocturia x2.  MUSCULOSKELETAL:  The patient's right knee gives him some pain  in the medial and infrapatellar regions.  No dermatologic or neurologic  complaints.   PHYSICAL EXAMINATION:  VITAL SIGNS:  Temperature was 96.8, blood pressure  125/62, pulse 53, weight 151.  GENERAL APPEARANCE:  A slender gentleman looking younger than his stated  chronologic age, in no acute distress.  HEENT:  Normocephalic and atraumatic.  EAC's and TM's were unremarkable.  Oropharynx with native dentition in good repair.  No buccal or palatal  lesions were noted.  Posterior pharynx was clear.  Conjunctivae and sclerae  were clear.  Pupils equal, round and reactive to light and accommodation.  Extraocular movements intact.  Funduscopic exam was unremarkable.  NECK:  Supple without thyromegaly.  NODES:  No adenopathy was noted in the cervical or supraclavicular  regions.  CHEST:  No CVA tenderness.  The patient has a well-healed sternotomy scar.  LUNGS:  Clear with no rales, wheezes or rhonchi.  CARDIOVASCULAR:  There were 2+ radial pulses.  No JVD or carotid bruits.  He  had a quiet precordium with regular rate and rhythm without murmurs, rubs,  or gallops.  ABDOMEN:  The patient has a well-healed right upper quadrant surgical scar.  He had no organosplenomegaly, no guarding, no tenderness, no rebound.  RECTAL:  Normal sphincter tone was noted.  The prostate was smooth, round,  normal in size and contour without nodules with well preserved sulcus.  EXTREMITIES:  Without clubbing, cyanosis, or edema.  No deformities are  noted.   LABORATORY DATA:  Labs from May 19, 2006 with normal metabolic panel with a  glucose of 91, normal liver functions.  Cholesterol was 93, triglycerides  66, HDL 39.6, LDL was 40.  The last PSA on May 19, 2005 was 1.61 with a  normal examination today.   ASSESSMENT AND PLAN:  1. Cardiovascular.  The patient is followed on a regular basis by Dr.  Juanito Best and currently is stable and doing well.  2. Lipids.  The patient's lipid control is excellent.  He will continue on      Crestor 20 mg daily.  He is to have followup laboratory per Dr. Daleen Best.  3. Hypertension.  The patient's blood pressure is very well controlled on      his present medical regimen, and he will continue the same.  4. Health maintenance.  The patient is due for colorectal cancer      screening, and will be referred for full colonoscopy.   In summary, this is a pleasant gentleman who seems medically stable at his  time.  He will return to see me on a p.r.n. basis.                                   Spencer Gess Norins, MD   MEN/MedQ  DD:  06/28/2006  DT:  06/28/2006  Job #:  [id number]   cc:   Spencer Cal Wall, MD, [facility]

## 2011-05-26 ENCOUNTER — Encounter: Payer: Self-pay | Admitting: Cardiology

## 2011-06-22 ENCOUNTER — Other Ambulatory Visit: Payer: Self-pay | Admitting: Cardiology

## 2011-06-25 ENCOUNTER — Encounter: Payer: Self-pay | Admitting: Cardiology

## 2011-06-25 ENCOUNTER — Ambulatory Visit (INDEPENDENT_AMBULATORY_CARE_PROVIDER_SITE_OTHER): Payer: Medicare Other | Admitting: Cardiology

## 2011-06-25 VITALS — BP 120/56 | HR 56 | Ht 67.0 in | Wt 154.8 lb

## 2011-06-25 DIAGNOSIS — E785 Hyperlipidemia, unspecified: Secondary | ICD-10-CM

## 2011-06-25 DIAGNOSIS — I10 Essential (primary) hypertension: Secondary | ICD-10-CM

## 2011-06-25 DIAGNOSIS — I2510 Atherosclerotic heart disease of native coronary artery without angina pectoris: Secondary | ICD-10-CM

## 2011-06-25 DIAGNOSIS — I2119 ST elevation (STEMI) myocardial infarction involving other coronary artery of inferior wall: Secondary | ICD-10-CM

## 2011-06-25 NOTE — Progress Notes (Signed)
HPI Spencer Best comes in today for followup of his coronary artery disease and history of iinferior wall infarction followed by bypass surgery. He has done remarkably well. He is very compliant with his medications. He has never taken several nitroglycerin.  Laboratory data from the [place] reviewed. Lipids are at goal is a very high HDL. His blood pressures but under good control.  EKG shows sinus bradycardia, otherwise normal EKG. Past Medical History  Diagnosis Date  . CAD (coronary artery disease)   . Post PTCA   . S/P CABG (coronary artery bypass graft)   . HTN (hypertension)   . Hyperlipidemia   . Knee pain, bilateral   . Hemorrhoids   . Hx of colonoscopy     Past Surgical History  Procedure Date  . Coronary angioplasty with stent placement   . Coronary artery bypass graft   . Correction of abdominal birth defect     at age 51  . L internal mammary artery to l anterior, saphenous vein graft     Family History  Problem Relation Age of Onset  . Coronary artery disease Father   . Osteoarthritis Father   . Diverticulitis Mother   . Lung cancer Brother   . Colon cancer Neg Hx   . Prostate cancer Neg Hx   . Diabetes Neg Hx     History   Social History  . Marital Status: Married    Spouse Name: N/A    Number of Children: 2  . Years of Education: N/A   Occupational History  . Full time on farm    Social History Main Topics  . Smoking status: Former Smoker    Quit date: 06/25/1971  . Smokeless tobacco: Not on file  . Alcohol Use: Not on file  . Drug Use: Not on file  . Sexually Active: Not on file   Other Topics Concern  . Not on file   Social History Narrative  . No narrative on file    No Known Allergies  Current Outpatient Prescriptions  Medication Sig Dispense Refill  . aspirin 325 MG tablet Take 325 mg by mouth daily.        . CRESTOR 20 MG tablet TAKE ONE TABLET BY MOUTH EVERY DAY  30 each  6  . Glucosamine 500 MG CAPS Take 1 capsule by mouth daily.         . nitroGLYCERIN (NITROSTAT) 0.4 MG SL tablet Place 0.4 mg under the tongue every 5 (five) minutes as needed.        . Omega-3 Fatty Acids (FISH OIL) 1200 MG CAPS Take 1 capsule by mouth daily.        . trandolapril (MAVIK) 2 MG tablet TAKE ONE TABLET BY MOUTH EVERY DAY  30 tablet  6    ROS Negative other than HPI.   PE   General Appearance: well developed, well nourished in no acute distress HEENT: symmetrical face, PERRLA, good dentition  Neck: no JVD, thyromegaly, or adenopathy, trachea midline Chest: symmetric without deformity Cardiac: PMI non-displaced, RRR, normal S1, S2, no gallop or murmur Lung: clear to ausculation and percussion Vascular: all pulses full without bruits  Abdominal: nondistended, nontender, good bowel sounds, no HSM, no bruits Extremities: no cyanosis, clubbing or edema, no sign of DVT, no varicosities  Skin: normal color, no rashes Neuro: alert and oriented x 3, non-focal Pysch: normal affect Filed Vitals:   06/25/11 1011  BP: 120/56  Pulse: 56  Height: 5\' 7"  (1.702 m)  Weight:  154 lb 12.8 oz (70.217 kg)    EKG  Labs and Studies Reviewed.   Lab Results  Component Value Date   WBC 5.9 10/14/2010   HGB 12.7* 10/14/2010   HCT 37.8* 10/14/2010   MCV 91.1 10/14/2010   PLT 163 10/14/2010      Chemistry      Component Value Date/Time   NA 135 10/14/2010 0414   K 4.3 10/14/2010 0414   CL 103 10/14/2010 0414   CO2 27 10/14/2010 0414   BUN 11 10/14/2010 0414   CREATININE 0.92 10/14/2010 0414      Component Value Date/Time   CALCIUM 8.8 10/14/2010 0414   ALKPHOS 35* 10/14/2010 0414   AST 20 10/14/2010 0414   ALT 14 10/14/2010 0414   BILITOT 0.6 10/14/2010 0414       Lab Results  Component Value Date   CHOL  Value: 119        ATP III CLASSIFICATION:  <200     mg/dL   Desirable  098-119  mg/dL   Borderline High  >=147    mg/dL   High        82/95/6213   CHOL 141 10/21/2009   CHOL 135 09/23/2008   Lab Results  Component Value Date    HDL 47 10/14/2010   HDL 52.50 10/21/2009   HDL 55.8 09/23/2008   Lab Results  Component Value Date   LDLCALC  Value: 61        Total Cholesterol/HDL:CHD Risk Coronary Heart Disease Risk Table                     Men   Women  1/2 Average Risk   3.4   3.3  Average Risk       5.0   4.4  2 X Average Risk   9.6   7.1  3 X Average Risk  23.4   11.0        Use the calculated Patient Ratio above and the CHD Risk Table to determine the patient's CHD Risk.        ATP III CLASSIFICATION (LDL):  <100     mg/dL   Optimal  086-578  mg/dL   Near or Above                    Optimal  130-159  mg/dL   Borderline  469-629  mg/dL   High  >528     mg/dL   Very High 41/32/4401   LDLCALC 69 10/21/2009   LDLCALC 62 09/23/2008   Lab Results  Component Value Date   TRIG 54 10/14/2010   TRIG 98.0 10/21/2009   TRIG 88 09/23/2008   Lab Results  Component Value Date   CHOLHDL 2.5 10/14/2010   CHOLHDL 3 10/21/2009   CHOLHDL 2.4 CALC 09/23/2008   No results found for this basename: HGBA1C   Lab Results  Component Value Date   ALT 14 10/14/2010   AST 20 10/14/2010   ALKPHOS 35* 10/14/2010   BILITOT 0.6 10/14/2010   Lab Results  Component Value Date   TSH 5.064* 10/13/2010

## 2011-06-25 NOTE — Patient Instructions (Signed)
Your physician recommends that you schedule a follow-up appointment in: 1 year with Dr. Wall  

## 2011-06-25 NOTE — Assessment & Plan Note (Signed)
Stable. No change in treatment. 

## 2011-07-24 ENCOUNTER — Emergency Department (HOSPITAL_COMMUNITY)
Admission: EM | Admit: 2011-07-24 | Discharge: 2011-07-24 | Disposition: A | Discharge status: Home / Self Care | Payer: Medicare Other | Attending: Emergency Medicine | Admitting: Emergency Medicine

## 2011-07-24 ENCOUNTER — Emergency Department (HOSPITAL_COMMUNITY): Payer: Medicare Other

## 2011-07-24 DIAGNOSIS — M79609 Pain in unspecified limb: Secondary | ICD-10-CM | POA: Insufficient documentation

## 2011-07-24 DIAGNOSIS — W64XXXA Exposure to other animate mechanical forces, initial encounter: Secondary | ICD-10-CM | POA: Insufficient documentation

## 2011-07-24 DIAGNOSIS — I2510 Atherosclerotic heart disease of native coronary artery without angina pectoris: Secondary | ICD-10-CM | POA: Insufficient documentation

## 2011-07-24 DIAGNOSIS — M25469 Effusion, unspecified knee: Secondary | ICD-10-CM | POA: Insufficient documentation

## 2011-07-24 DIAGNOSIS — S8010XA Contusion of unspecified lower leg, initial encounter: Secondary | ICD-10-CM | POA: Insufficient documentation

## 2011-07-24 DIAGNOSIS — M25569 Pain in unspecified knee: Secondary | ICD-10-CM | POA: Insufficient documentation

## 2011-07-24 DIAGNOSIS — Z79899 Other long term (current) drug therapy: Secondary | ICD-10-CM | POA: Insufficient documentation

## 2011-07-24 DIAGNOSIS — E7800 Pure hypercholesterolemia, unspecified: Secondary | ICD-10-CM | POA: Insufficient documentation

## 2011-07-24 DIAGNOSIS — R609 Edema, unspecified: Secondary | ICD-10-CM | POA: Insufficient documentation

## 2011-07-24 DIAGNOSIS — S99929A Unspecified injury of unspecified foot, initial encounter: Secondary | ICD-10-CM | POA: Insufficient documentation

## 2011-07-24 DIAGNOSIS — S8990XA Unspecified injury of unspecified lower leg, initial encounter: Secondary | ICD-10-CM | POA: Insufficient documentation

## 2011-07-24 DIAGNOSIS — S99919A Unspecified injury of unspecified ankle, initial encounter: Secondary | ICD-10-CM | POA: Insufficient documentation

## 2011-07-24 DIAGNOSIS — I10 Essential (primary) hypertension: Secondary | ICD-10-CM | POA: Insufficient documentation

## 2011-07-24 DIAGNOSIS — Y92009 Unspecified place in unspecified non-institutional (private) residence as the place of occurrence of the external cause: Secondary | ICD-10-CM | POA: Insufficient documentation

## 2011-07-24 DIAGNOSIS — M7989 Other specified soft tissue disorders: Secondary | ICD-10-CM

## 2011-07-30 ENCOUNTER — Encounter: Payer: Self-pay | Admitting: Internal Medicine

## 2011-07-30 ENCOUNTER — Ambulatory Visit (INDEPENDENT_AMBULATORY_CARE_PROVIDER_SITE_OTHER): Payer: Medicare Other | Admitting: Internal Medicine

## 2011-07-30 DIAGNOSIS — L0291 Cutaneous abscess, unspecified: Secondary | ICD-10-CM

## 2011-07-30 DIAGNOSIS — Z23 Encounter for immunization: Secondary | ICD-10-CM

## 2011-07-30 DIAGNOSIS — M79604 Pain in right leg: Secondary | ICD-10-CM | POA: Insufficient documentation

## 2011-07-30 DIAGNOSIS — L0390 Cellulitis, unspecified: Secondary | ICD-10-CM | POA: Insufficient documentation

## 2011-07-30 DIAGNOSIS — I10 Essential (primary) hypertension: Secondary | ICD-10-CM

## 2011-07-30 DIAGNOSIS — M79609 Pain in unspecified limb: Secondary | ICD-10-CM

## 2011-07-30 MED ORDER — TRAMADOL HCL 50 MG PO TABS: 50.0000 mg | ORAL_TABLET | Freq: Four times a day (QID) | ORAL | Status: AC | PRN

## 2011-07-30 MED ORDER — DOXYCYCLINE HYCLATE 100 MG PO TABS: 100.0000 mg | ORAL_TABLET | Freq: Two times a day (BID) | ORAL | Status: AC

## 2011-07-30 NOTE — Patient Instructions (Signed)
Take all new medications as prescribed - the antibiotic and pain medication Please avoid strenuous work for 2 weeks Continue all other medications as before

## 2011-07-31 ENCOUNTER — Encounter: Payer: Self-pay | Admitting: Internal Medicine
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
## 2011-07-31 NOTE — Assessment & Plan Note (Signed)
Mild to mod, for antibx course,  to f/u any worsening symptoms or concerns 
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
## 2011-07-31 NOTE — Assessment & Plan Note (Signed)
stable overall by hx and exam, most recent data reviewed with pt, and pt to continue medical treatment as before'  BP Readings from Last 3 Encounters:  07/30/11 140/50  06/25/11 120/56  10/22/10 130/66

## 2011-09-15 DIAGNOSIS — H02409 Unspecified ptosis of unspecified eyelid: Secondary | ICD-10-CM | POA: Insufficient documentation

## 2011-09-15 DIAGNOSIS — IMO0002 Reserved for concepts with insufficient information to code with codable children: Secondary | ICD-10-CM | POA: Insufficient documentation

## 2011-10-21 ENCOUNTER — Encounter: Payer: Self-pay | Admitting: Internal Medicine

## 2011-10-28 ENCOUNTER — Encounter: Payer: Self-pay | Admitting: Internal Medicine

## 2011-10-28 ENCOUNTER — Ambulatory Visit (INDEPENDENT_AMBULATORY_CARE_PROVIDER_SITE_OTHER): Payer: Medicare Other | Admitting: Internal Medicine

## 2011-10-28 ENCOUNTER — Other Ambulatory Visit (INDEPENDENT_AMBULATORY_CARE_PROVIDER_SITE_OTHER): Payer: Medicare Other

## 2011-10-28 VITALS — BP 128/68 | HR 56 | Temp 97.2°F | Resp 14 | Ht 67.25 in | Wt 167.5 lb

## 2011-10-28 DIAGNOSIS — I2510 Atherosclerotic heart disease of native coronary artery without angina pectoris: Secondary | ICD-10-CM | POA: Diagnosis not present

## 2011-10-28 DIAGNOSIS — I10 Essential (primary) hypertension: Secondary | ICD-10-CM

## 2011-10-28 DIAGNOSIS — M79604 Pain in right leg: Secondary | ICD-10-CM

## 2011-10-28 DIAGNOSIS — M79609 Pain in unspecified limb: Secondary | ICD-10-CM | POA: Diagnosis not present

## 2011-10-28 DIAGNOSIS — Z0000 Encounter for general adult medical examination without abnormal findings: Secondary | ICD-10-CM | POA: Diagnosis not present

## 2011-10-28 DIAGNOSIS — E785 Hyperlipidemia, unspecified: Secondary | ICD-10-CM

## 2011-10-28 MED ORDER — ROSUVASTATIN CALCIUM 20 MG PO TABS: 20.0000 mg | ORAL_TABLET | Freq: Every day | ORAL | Status: DC

## 2011-10-28 MED ORDER — TRANDOLAPRIL 2 MG PO TABS: 2.0000 mg | ORAL_TABLET | Freq: Every day | ORAL | Status: DC

## 2011-10-28 NOTE — Progress Notes (Signed)
Subjective:    Patient ID: Spencer Best, male    DOB: 1933/05/21, 78 y.o.   MRN: [id number]  HPI The patient is here for annual Medicare wellness examination and management of other chronic and acute problems.   The risk factors are reflected in the social history.  The roster of all physicians providing medical care to patient - is listed in the Snapshot section of the chart.  Activities of daily living:  The patient is 100% inedpendent in all ADLs: dressing, toileting, feeding as well as independent mobility  Home safety : The patient has smoke detectors in the home. They wear seatbelts once in a while.. firearms are present in the home, kept in a safe fashion.  There is no violence in the home. No recent falls. Does have clutter. He does have grab bars in tub/shower, shower seat, grab bar by commode.  There is no risks for hepatitis, STDs or HIV. There is no   history of blood transfusion. They have no travel history to infectious disease endemic areas of the world.  The patient has seen their dentist in the last six month. They have seen their eye doctor in the last year. They deny any hearing difficulty and have not had audiologic testing in the last year.  They do not  have excessive sun exposure. Discussed the need for sun protection: hats, long sleeves and use of sunscreen if there is significant sun exposure.   Diet: the importance of a healthy diet is discussed. They do have a healthy diet.  The patient has a regular exercise program: uses machines and treadmill , 40-60 min duration, 3 per week.  The benefits of regular aerobic exercise were discussed.  Depression screen: there are no signs or vegative symptoms of depression- irritability, change in appetite, anhedonia, sadness/tearfullness.  Cognitive assessment: the patient manages all their financial and personal affairs and is actively engaged.   The following portions of the patient's history were reviewed and updated as  appropriate: allergies, current medications, past family history, past medical history,  past surgical history, past social history  and problem list.  Vision, hearing, body mass index were assessed and reviewed.   During the course of the visit the patient was educated and counseled about appropriate screening and preventive services including : fall prevention , diabetes screening, nutrition counseling, colorectal cancer screening, and recommended immunizations.    Review of Systems Constitutional:  Negative for fever, chills, activity change and unexpected weight change.  HEENT:  Negative for hearing loss, ear pain, congestion, neck stiffness and postnasal drip. Negative for sore throat or swallowing problems. Negative for dental complaints.   Eyes: Negative for vision loss or change in visual acuity.  Respiratory: Negative for chest tightness and wheezing. Negative for DOE.   Cardiovascular: Negative for chest pain or palpitations. No decreased exercise tolerance Gastrointestinal: No change in bowel habit. No bloating or gas. No reflux or indigestion Genitourinary: Negative for urgency, frequency, flank pain and difficulty urinating.  Musculoskeletal: Negative for myalgias, back pain, arthralgias and gait problem.  Neurological: Negative for dizziness, tremors, weakness and headaches.  Hematological: Negative for adenopathy.  Psychiatric/Behavioral: Negative for behavioral problems and dysphoric mood.       Objective:   Physical Exam Filed Vitals:   10/28/11 1405  BP: 128/68  Pulse: 56  Temp: 97.2 F (36.2 C)  Resp: 14   Gen'l- well nourished trim gentleman in no distress HEENT- [place]/AT, nose is red and broad at the bridge -chronic changes;  C&S clear, PERRLA, oropharynx w/o lesions Neck - supple, w/o thyromegaly Nodes - negataive Chest- no deformity Pulm - normal respirations Cor- 2+ radial and DP pulses, RRR w/o m/r/g Abd- BS+, soft, no HSM Genitalia/rectal-deferred Ext-  no deformity, especially the knees -looked all right, right calve all right after blunt trauma (cow kick) Derm - chronic hemosiderin changes calve right. Rankle with mild erythematous rash. Neuro A&O x 3, speech clear, cognition normal, DTRs 2+ symmetrical, MS - normal, normal gait and station.  Lab Results  Component Value Date   WBC 5.9 10/14/2010   HGB 12.7* 10/14/2010   HCT 37.8* 10/14/2010   PLT 163 10/14/2010   GLUCOSE 89 10/28/2011   CHOL 126 10/28/2011   TRIG 106.0 10/28/2011   HDL 53.20 10/28/2011   LDLCALC 52 10/28/2011   ALT 18 10/28/2011   ALT 18 10/28/2011   AST 22 10/28/2011   AST 22 10/28/2011   NA 134* 10/28/2011   K 4.6 10/28/2011   CL 99 10/28/2011   CREATININE 0.7 10/28/2011   BUN 12 10/28/2011   CO2 30 10/28/2011   TSH 5.064* 10/13/2010   PSA 1.05 09/23/2008   INR 1.02 10/13/2010          Assessment & Plan:

## 2011-10-28 NOTE — Patient Instructions (Signed)
A good exam. The leg is most likely going to stay dark. The rash at the ankle is a chronic skin problem and the trimacinolone is excellent.  Come back in a year or sooner if you need me.

## 2011-10-29 LAB — HEPATIC FUNCTION PANEL
Albumin: 4.3 g/dL (ref 3.5–5.2)
Alkaline Phosphatase: 40 U/L (ref 39–117)
Bilirubin, Direct: 0.1 mg/dL (ref 0.0–0.3)
Total Bilirubin: 0.4 mg/dL (ref 0.3–1.2)

## 2011-10-29 LAB — LIPID PANEL
Cholesterol: 126 mg/dL (ref 0–200)
Triglycerides: 106 mg/dL (ref 0.0–149.0)

## 2011-10-29 LAB — COMPREHENSIVE METABOLIC PANEL
ALT: 18 U/L (ref 0–53)
BUN: 12 mg/dL (ref 6–23)
CO2: 30 mEq/L (ref 19–32)
Calcium: 9.1 mg/dL (ref 8.4–10.5)
Chloride: 99 mEq/L (ref 96–112)
Creatinine, Ser: 0.7 mg/dL (ref 0.4–1.5)
GFR: 112.67 mL/min (ref >60.00)
Glucose, Bld: 89 mg/dL (ref 70–99)

## 2011-10-31 DIAGNOSIS — Z0000 Encounter for general adult medical examination without abnormal findings: Secondary | ICD-10-CM | POA: Insufficient documentation

## 2011-10-31 NOTE — Assessment & Plan Note (Signed)
Continued discomfort but he is not limited in his activity. He has followed with orthopedics. No need for further evaluation at this time.

## 2011-10-31 NOTE — Assessment & Plan Note (Signed)
Lab reveals excellent control with LDL well below goal of 80. No adverse effects of medication.  Plan- continue the same regimen

## 2011-10-31 NOTE — Assessment & Plan Note (Signed)
Interval medical history is bernign. Limited physical exam is normal. Lab results are in normal range. He is current with colorectal cancer screening. He is current with immunizations.  In Summary - a very nice man who is medically stable. He does have questions about his ankle and he is advised to see a dermatologist. He will remain being active. He will return as needed or in 1 year.

## 2011-10-31 NOTE — Assessment & Plan Note (Signed)
BP Readings from Last 3 Encounters:  10/28/11 128/68  07/30/11 140/50  06/25/11 120/56   Good control. Continue present meds.

## 2011-10-31 NOTE — Assessment & Plan Note (Signed)
Stable and doing well. No cardiac complaints or symptoms and he remains very active. He is current with cardiology  Plan- continue risk reduction          Continue present medications

## 2011-12-27 ENCOUNTER — Ambulatory Visit: Payer: Medicare Other | Attending: Internal Medicine | Admitting: Physical Therapy

## 2011-12-27 DIAGNOSIS — R269 Unspecified abnormalities of gait and mobility: Secondary | ICD-10-CM | POA: Insufficient documentation

## 2011-12-27 DIAGNOSIS — IMO0001 Reserved for inherently not codable concepts without codable children: Secondary | ICD-10-CM | POA: Insufficient documentation

## 2012-01-05 ENCOUNTER — Ambulatory Visit: Payer: Medicare Other | Admitting: Physical Therapy

## 2012-01-10 ENCOUNTER — Ambulatory Visit: Payer: Medicare Other | Admitting: Physical Therapy

## 2012-01-10 DIAGNOSIS — IMO0001 Reserved for inherently not codable concepts without codable children: Secondary | ICD-10-CM | POA: Diagnosis not present

## 2012-01-10 DIAGNOSIS — R269 Unspecified abnormalities of gait and mobility: Secondary | ICD-10-CM | POA: Diagnosis not present

## 2012-01-17 ENCOUNTER — Other Ambulatory Visit: Payer: Self-pay | Admitting: Cardiology

## 2012-02-08 ENCOUNTER — Encounter: Payer: Medicare Other | Admitting: Physical Therapy

## 2012-02-08 ENCOUNTER — Ambulatory Visit: Payer: Medicare Other | Attending: Internal Medicine | Admitting: Physical Therapy

## 2012-02-08 DIAGNOSIS — R269 Unspecified abnormalities of gait and mobility: Secondary | ICD-10-CM | POA: Insufficient documentation

## 2012-02-08 DIAGNOSIS — IMO0001 Reserved for inherently not codable concepts without codable children: Secondary | ICD-10-CM | POA: Insufficient documentation

## 2012-02-16 ENCOUNTER — Other Ambulatory Visit: Payer: Self-pay | Admitting: Cardiology

## 2012-04-26 ENCOUNTER — Other Ambulatory Visit: Payer: Self-pay | Admitting: Cardiology

## 2012-06-21 ENCOUNTER — Encounter: Payer: Self-pay | Admitting: *Deleted

## 2012-06-27 ENCOUNTER — Ambulatory Visit (INDEPENDENT_AMBULATORY_CARE_PROVIDER_SITE_OTHER): Payer: Medicare Other | Admitting: Cardiology

## 2012-06-27 ENCOUNTER — Encounter: Payer: Self-pay | Admitting: Cardiology

## 2012-06-27 VITALS — BP 130/62 | HR 54 | Ht 67.25 in | Wt 162.0 lb

## 2012-06-27 DIAGNOSIS — Z951 Presence of aortocoronary bypass graft: Secondary | ICD-10-CM

## 2012-06-27 DIAGNOSIS — I2510 Atherosclerotic heart disease of native coronary artery without angina pectoris: Secondary | ICD-10-CM

## 2012-06-27 DIAGNOSIS — E785 Hyperlipidemia, unspecified: Secondary | ICD-10-CM

## 2012-06-27 DIAGNOSIS — I10 Essential (primary) hypertension: Secondary | ICD-10-CM | POA: Diagnosis not present

## 2012-06-27 DIAGNOSIS — Z9861 Coronary angioplasty status: Secondary | ICD-10-CM

## 2012-06-27 DIAGNOSIS — I2119 ST elevation (STEMI) myocardial infarction involving other coronary artery of inferior wall: Secondary | ICD-10-CM

## 2012-06-27 MED ORDER — NITROGLYCERIN 0.4 MG SL SUBL: 0.4000 mg | SUBLINGUAL_TABLET | SUBLINGUAL | Status: DC | PRN

## 2012-06-27 MED ORDER — TRANDOLAPRIL 2 MG PO TABS: 2.0000 mg | ORAL_TABLET | Freq: Every day | ORAL | Status: DC

## 2012-06-27 MED ORDER — ROSUVASTATIN CALCIUM 20 MG PO TABS: 20.0000 mg | ORAL_TABLET | Freq: Every day | ORAL | Status: DC

## 2012-06-27 NOTE — Progress Notes (Signed)
HPI Spencer Best returns today for evaluation and management of his history of coronary disease history of bypass surgery 18 years ago. He continues to do remarkably well and remains very active. He is having no chest pain or angina.  He is very compliant with his medications. Primary care follows his blood work.    Past Medical History  Diagnosis Date  . CAD (coronary artery disease)   . Post PTCA   . S/P CABG (coronary artery bypass graft)   . HTN (hypertension)   . Hyperlipidemia   . Knee pain, bilateral   . Hemorrhoids   . Hx of colonoscopy   . Cellulitis 07/30/2011  . CORONARY ARTERY BYPASS GRAFT, TWO VESSEL, HX OF 08/16/2007  . CORONARY ARTERY DISEASE 08/16/2007  . HEMORRHOIDS, HX OF 08/16/2007  . HYPERLIPIDEMIA 08/16/2007  . HYPERTENSION 08/16/2007  . KNEE PAIN, BILATERAL 01/30/2009  . LABRYNTHITIS 12/16/2009  . MYOCARDIAL INFARCTION, INFERIOR [place], SUBSEQUENT CARE 06/11/2009  . PERCUTANEOUS TRANSLUMINAL CORONARY ANGIOPLASTY, HX OF 08/16/2007  . Right leg pain 07/30/2011  . SYNCOPE, VASOVAGAL 10/22/2010    Current Outpatient Prescriptions  Medication Sig Dispense Refill  . aspirin 325 MG tablet Take 325 mg by mouth daily.        . CRESTOR 20 MG tablet TAKE ONE TABLET BY MOUTH EVERY DAY  30 each  5  . Glucosamine 500 MG CAPS Take 1 capsule by mouth daily.        Marland Kitchen NITROSTAT 0.4 MG SL tablet DISSOLVE ONE TABLET UNDER THE TONGUE EVERY 5 MINUTES AS NEEDED FOR CHEST PAIN.  DO NOT EXCEED A TOTAL OF 3 DOSES IN 15 MINUTES  25 each  11  . Omega-3 Fatty Acids (FISH OIL) 1200 MG CAPS Take 1 capsule by mouth daily.        . trandolapril (MAVIK) 2 MG tablet Take 1 tablet (2 mg total) by mouth daily.  30 tablet  11  . triamcinolone cream (KENALOG) 0.1 % Apply 1 application topically 3 (three) times daily. Apply to Affected Areas/ankles.       Marland Kitchen DISCONTD: trandolapril (MAVIK) 2 MG tablet TAKE ONE TABLET BY MOUTH EVERY DAY  30 tablet  3    No Known Allergies  Family History  Problem  Relation Age of Onset  . Coronary artery disease Father   . Osteoarthritis Father   . Parkinsonism Father   . Diverticulitis Mother   . Lung cancer Brother   . Colon cancer Neg Hx   . Prostate cancer Neg Hx   . Arrhythmia Mother   . Rheum arthritis Father     History   Social History  . Marital Status: Married    Spouse Name: N/A    Number of Children: 2  . Years of Education: N/A   Occupational History  . Full time on farm    Social History Main Topics  . Smoking status: Former Smoker    Types: Cigarettes    Quit date: 06/25/1971  . Smokeless tobacco: Not on file  . Alcohol Use: Not on file  . Drug Use: Not on file  . Sexually Active: Not on file   Other Topics Concern  . Not on file   Social History Narrative   Married [id number] son 66, 1 daughter 59, 3 grandchildrenWork full time on the farm, keeping dogs boxerMarriage in good health    ROS ALL NEGATIVE EXCEPT THOSE NOTED IN HPI  PE  General Appearance: well developed, well nourished in no acute distress HEENT: symmetrical face,  PERRLA, good dentition  Neck: no JVD, thyromegaly, or adenopathy, trachea midline Chest: symmetric without deformity Cardiac: PMI non-displaced, RRR, normal S1, S2, no gallop or murmur Lung: clear to ausculation and percussion Vascular: all pulses full without bruits  Abdominal: nondistended, nontender, good bowel sounds, no HSM, no bruits Extremities: no cyanosis, clubbing or edema, no sign of DVT, no varicosities  Skin: normal color, no rashes Neuro: alert and oriented x 3, non-focal Pysch: normal affect  EKG Sinus bradycardia, rate 54, and otherwise normal EKG BMET    Component Value Date/Time   NA 134* 10/28/2011 1645   K 4.6 10/28/2011 1645   CL 99 10/28/2011 1645   CO2 30 10/28/2011 1645   GLUCOSE 89 10/28/2011 1645   BUN 12 10/28/2011 1645   CREATININE 0.7 10/28/2011 1645   CALCIUM 9.1 10/28/2011 1645   GFRNONAA >60 10/14/2010 0414   GFRAA  Value: >60        The eGFR has been  calculated using the MDRD equation. This calculation has not been validated in all clinical situations. eGFR's persistently <60 mL/min signify possible Chronic Kidney Disease. 10/14/2010 0414    Lipid Panel     Component Value Date/Time   CHOL 126 10/28/2011 1645   TRIG 106.0 10/28/2011 1645   HDL 53.20 10/28/2011 1645   CHOLHDL 2 10/28/2011 1645   VLDL 21.2 10/28/2011 1645   LDLCALC 52 10/28/2011 1645    CBC    Component Value Date/Time   WBC 5.9 10/14/2010 0414   RBC 4.15* 10/14/2010 0414   HGB 12.7* 10/14/2010 0414   HCT 37.8* 10/14/2010 0414   PLT 163 10/14/2010 0414   MCV 91.1 10/14/2010 0414   MCH 30.6 10/14/2010 0414   MCHC 33.6 10/14/2010 0414   RDW 12.8 10/14/2010 0414   LYMPHSABS 1.0 10/13/2010 0730   MONOABS 0.5 10/13/2010 0730   EOSABS 0.2 10/13/2010 0730   BASOSABS 0.0 10/13/2010 0730

## 2012-06-27 NOTE — Assessment & Plan Note (Signed)
Stable. Continue aggressive secondary preventative therapy. Lipids reviewed and were at goal earlier this year. Return to the office in a year.

## 2012-06-27 NOTE — Patient Instructions (Addendum)
Your physician wants you to follow-up in: 1 YEAR WITH DR. Daleen Squibb. You will receive a reminder letter in the mail two months in advance. If you don't receive a letter, please call our office to schedule the follow-up appointment.  REFILLS WERE SENT IN TODAY FOR TRANDOLAPRIL,, NTG, CRESTOR PER DR. WALL

## 2012-07-21 DIAGNOSIS — I781 Nevus, non-neoplastic: Secondary | ICD-10-CM | POA: Diagnosis not present

## 2012-07-21 DIAGNOSIS — D235 Other benign neoplasm of skin of trunk: Secondary | ICD-10-CM | POA: Diagnosis not present

## 2012-07-21 DIAGNOSIS — L570 Actinic keratosis: Secondary | ICD-10-CM | POA: Diagnosis not present

## 2012-08-09 DIAGNOSIS — Z23 Encounter for immunization: Secondary | ICD-10-CM | POA: Diagnosis not present

## 2012-09-06 DIAGNOSIS — H2510 Age-related nuclear cataract, unspecified eye: Secondary | ICD-10-CM | POA: Diagnosis not present

## 2012-09-06 DIAGNOSIS — H02409 Unspecified ptosis of unspecified eyelid: Secondary | ICD-10-CM | POA: Diagnosis not present

## 2012-09-15 ENCOUNTER — Ambulatory Visit (INDEPENDENT_AMBULATORY_CARE_PROVIDER_SITE_OTHER): Payer: Medicare Other | Admitting: Internal Medicine

## 2012-09-15 ENCOUNTER — Encounter: Payer: Self-pay | Admitting: Internal Medicine

## 2012-09-15 VITALS — BP 118/60 | HR 56 | Temp 97.2°F | Ht 67.0 in | Wt 160.4 lb

## 2012-09-15 DIAGNOSIS — I2510 Atherosclerotic heart disease of native coronary artery without angina pectoris: Secondary | ICD-10-CM

## 2012-09-15 DIAGNOSIS — J209 Acute bronchitis, unspecified: Secondary | ICD-10-CM

## 2012-09-15 DIAGNOSIS — I10 Essential (primary) hypertension: Secondary | ICD-10-CM | POA: Diagnosis not present

## 2012-09-15 MED ORDER — AZITHROMYCIN 250 MG PO TABS: ORAL_TABLET | ORAL | Status: DC

## 2012-09-15 MED ORDER — BENZONATATE 200 MG PO CAPS: 200.0000 mg | ORAL_CAPSULE | Freq: Three times a day (TID) | ORAL | Status: DC | PRN

## 2012-09-15 NOTE — Progress Notes (Signed)
  Subjective:    HPI  complains of head and chest cold symptoms  Onset >1 week ago, wax/wane symptoms  Initially associated with rhinorrhea, sneezing, sore throat, mild headache and low grade fever Also myalgias, sinus pressure and mild-mod chest congestion No relief with OTC meds Precipitated by sick contacts and weather changes per spouse  Past Medical History  Diagnosis Date  . CAD (coronary artery disease)   . Post PTCA   . S/P CABG (coronary artery bypass graft)   . CORONARY ARTERY BYPASS GRAFT, TWO VESSEL, HX OF   . CORONARY ARTERY DISEASE   . HEMORRHOIDS, HX OF   . HYPERLIPIDEMIA   . HYPERTENSION   . LABRYNTHITIS   . MYOCARDIAL INFARCTION, INFERIOR [place], SUBSEQUENT CARE 06/11/2009  . PERCUTANEOUS TRANSLUMINAL CORONARY ANGIOPLASTY, HX OF 08/16/2007  . SYNCOPE, VASOVAGAL     Review of Systems Constitutional: No fever or night sweats, no unexpected weight change Pulmonary: No pleurisy or hemoptysis Cardiovascular: No chest pain or palpitations     Objective:   Physical Exam BP 118/60  Pulse 56  Temp 97.2 F (36.2 C) (Oral)  Ht 5\' 7"  (1.702 m)  Wt 160 lb 6.4 oz (72.757 kg)  BMI 25.12 kg/m2  SpO2 99% GEN: mildly ill appearing and audible head/chest congestion; spouse at side HENT: NCAT, mild sinus tenderness bilaterally, nares with clear discharge, oropharynx mild erythema and PND, no exudate; TMS clear without erythema or effusion Eyes: Vision grossly intact, no conjunctivitis Lungs: Few scattered rhonchi -no wheeze, no increased work of breathing Cardiovascular: Regular rate and rhythm, no bilateral edema  Lab Results  Component Value Date   WBC 5.9 10/14/2010   HGB 12.7* 10/14/2010   HCT 37.8* 10/14/2010   PLT 163 10/14/2010   GLUCOSE 89 10/28/2011   CHOL 126 10/28/2011   TRIG 106.0 10/28/2011   HDL 53.20 10/28/2011   LDLCALC 52 10/28/2011   ALT 18 10/28/2011   ALT 18 10/28/2011   AST 22 10/28/2011   AST 22 10/28/2011   NA 134* 10/28/2011   K 4.6 10/28/2011   CL 99  10/28/2011   CREATININE 0.7 10/28/2011   BUN 12 10/28/2011   CO2 30 10/28/2011   TSH 5.064* 10/13/2010   PSA 1.05 09/23/2008   INR 1.02 10/13/2010      Assessment & Plan:  Viral URI > progression to acute bronchitis Cough, postnasal drip related to above    Empiric antibiotics prescribed due to symptom duration greater than 7 days Prescription cough suppression - new prescriptions done Symptomatic care with Tylenol or Advil, hydration and rest -  salt gargle advised as needed

## 2012-09-15 NOTE — Assessment & Plan Note (Signed)
Stable without anginal symptoms The current medical regimen is effective;  continue present plan and medications.

## 2012-09-15 NOTE — Assessment & Plan Note (Signed)
BP Readings from Last 3 Encounters:  09/15/12 118/60  06/27/12 130/62  10/28/11 128/68   The current medical regimen is effective;  continue present plan and medications. Advised to avoid decongestants as able

## 2012-09-15 NOTE — Patient Instructions (Signed)
It was good to see you today. Zpak antibiotics and prescription cough medication - Your prescription(s) have been submitted to your pharmacy. Please take as directed and contact our office if you believe you are having problem(s) with the medication(s). Alternate between ibuprofen and tylenol for aches, pain and fever symptoms as discussed Hydrate, rest and call if worse or unimproved

## 2012-10-12 IMAGING — CR DG KNEE COMPLETE 4+V*R*
4 series · 4 of 4 positions shown · non-contrast
Comparison: Right tib-fib series from the same day.

CLINICAL DATA: 78-year-old male status post blunt trauma with pain
and bruising.  Pain with weightbearing.

RIGHT KNEE - COMPLETE 4+ VIEW

[t knee ap right]
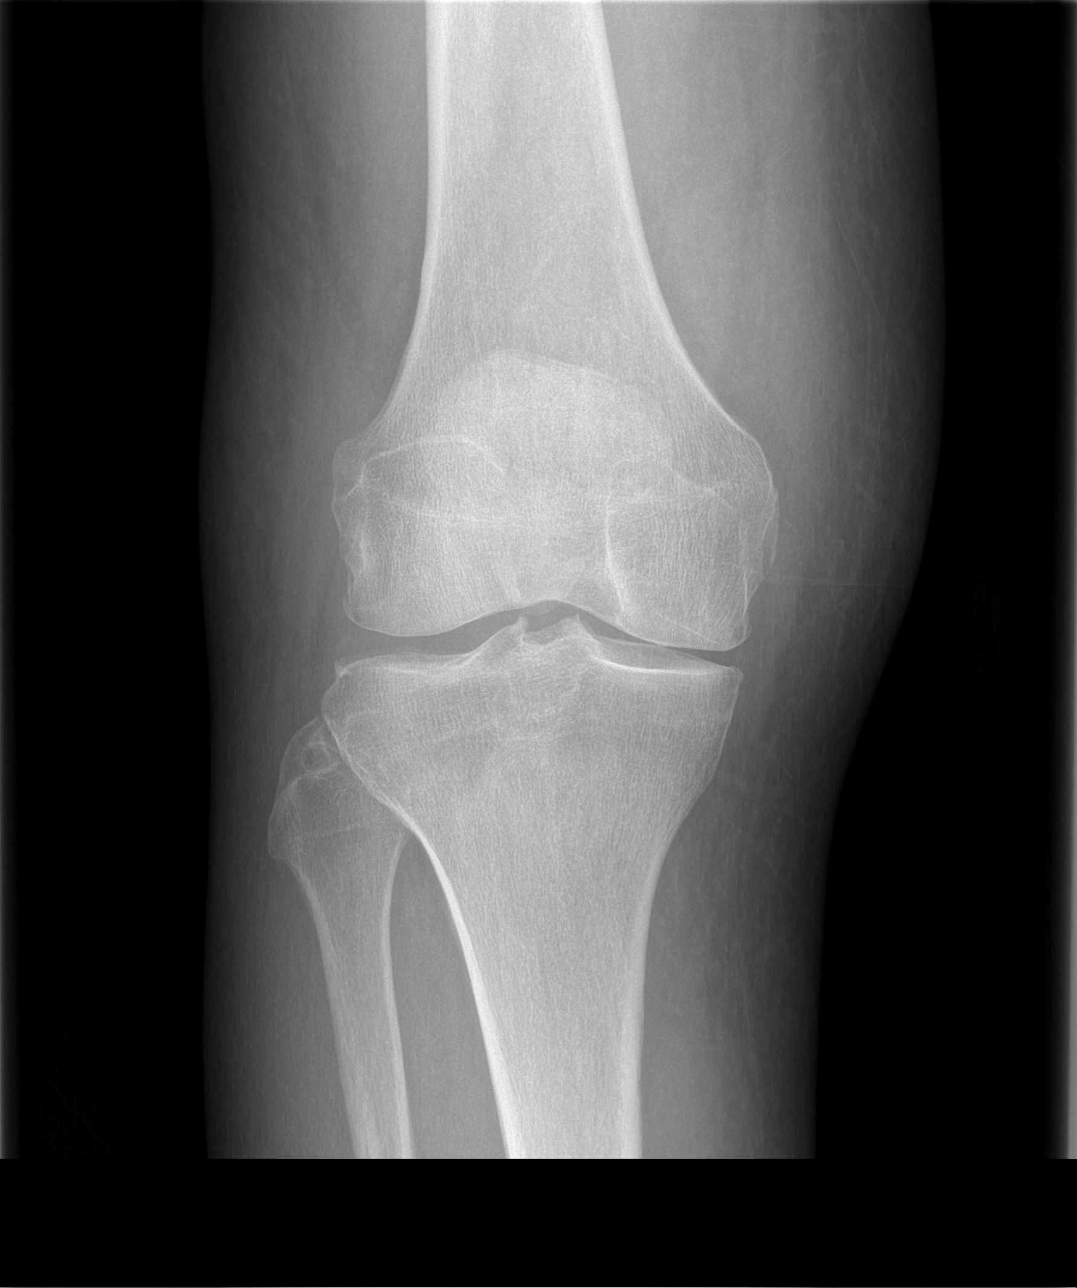

[t knee oblique right (1 of 2)]
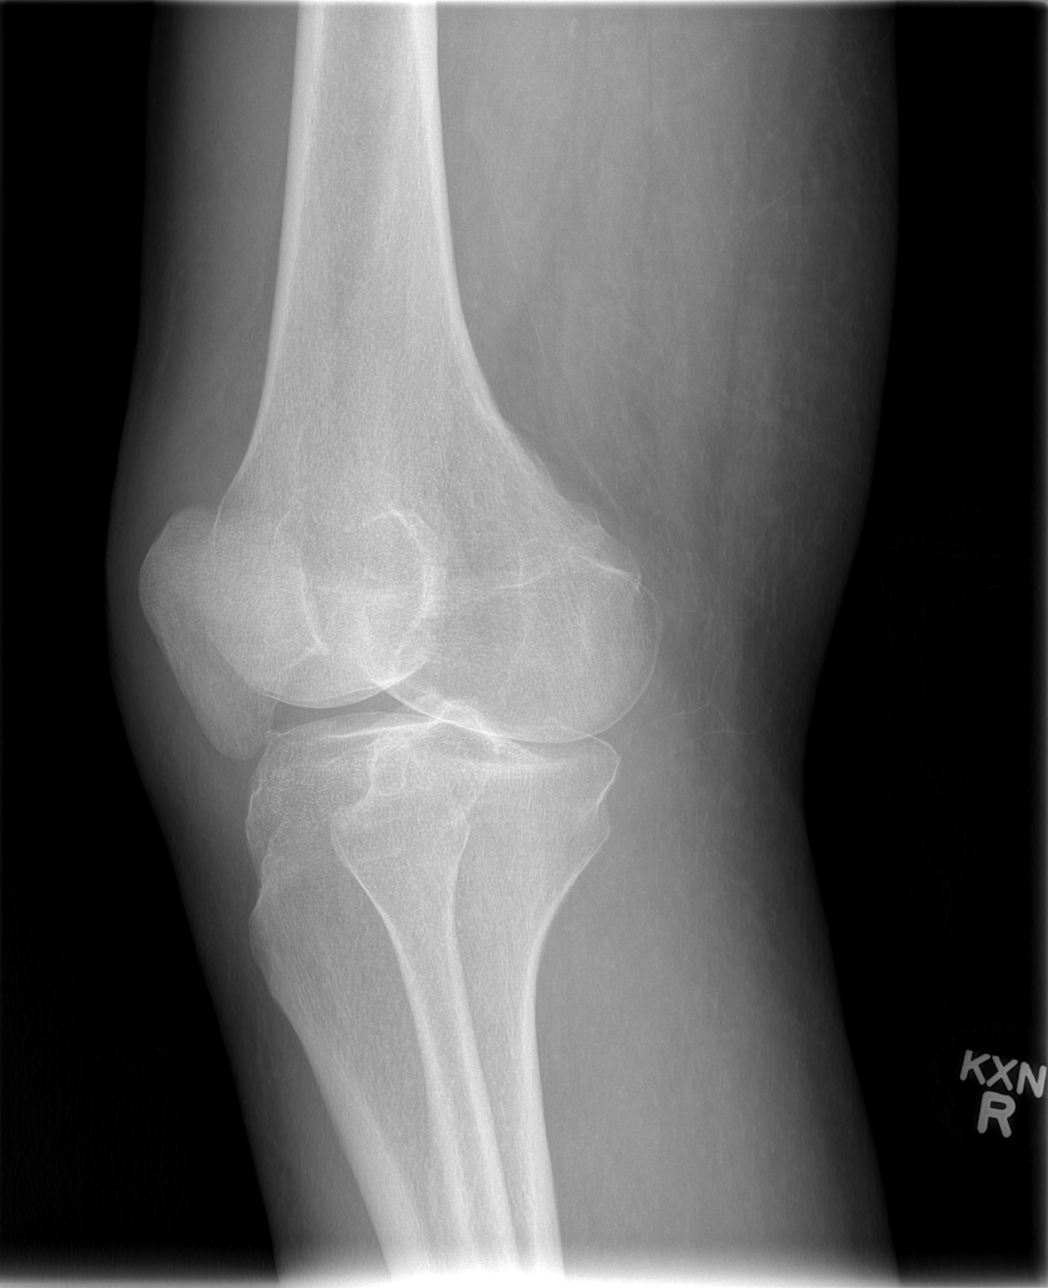

[t knee oblique right (2 of 2)]
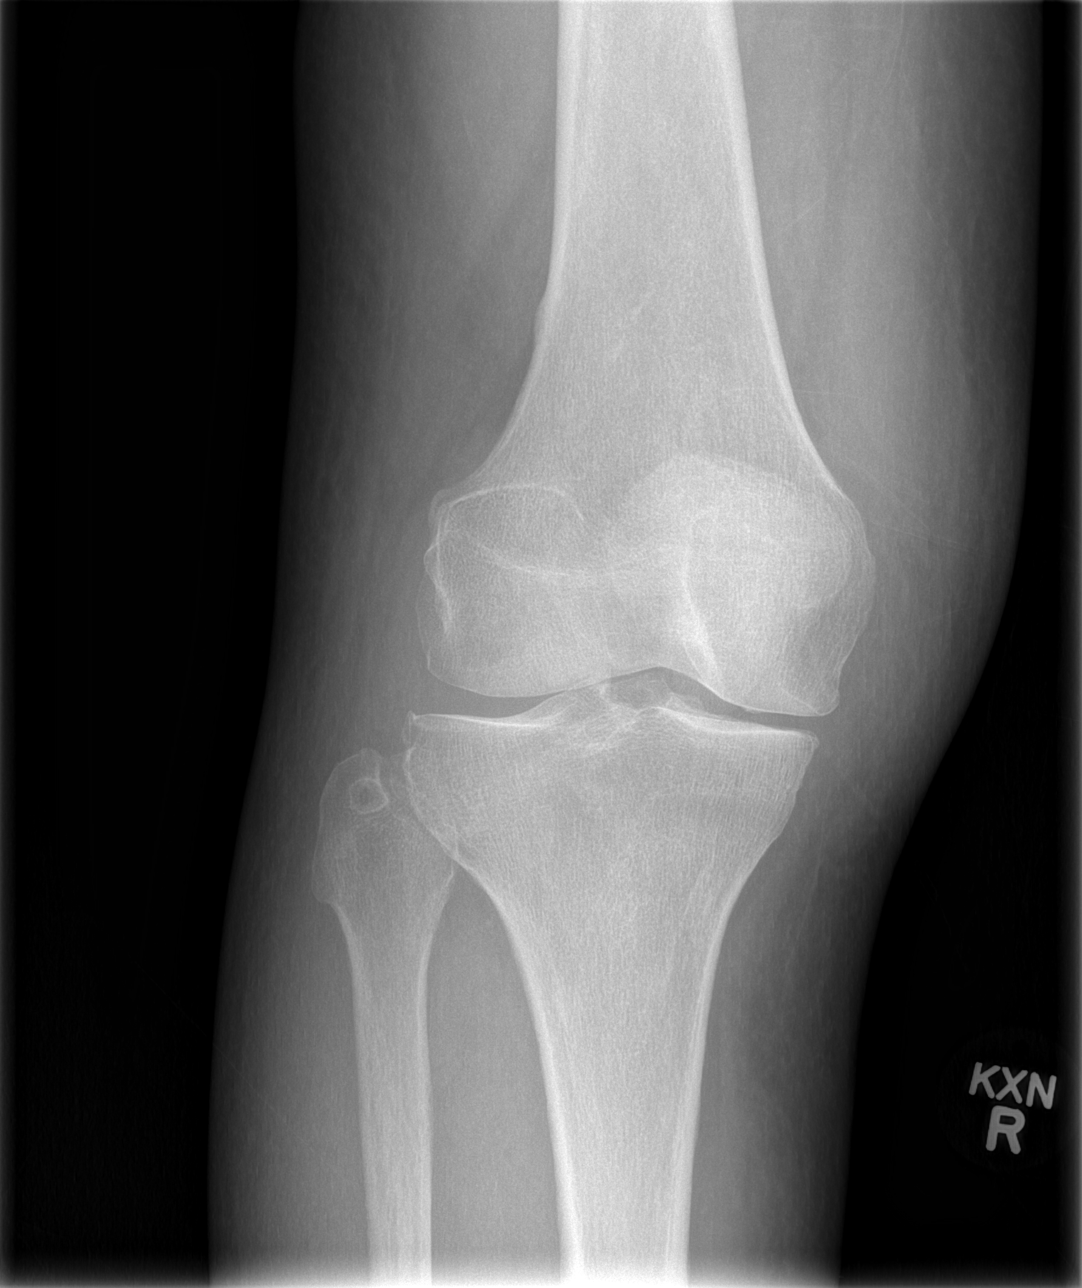

[t knee lat right]
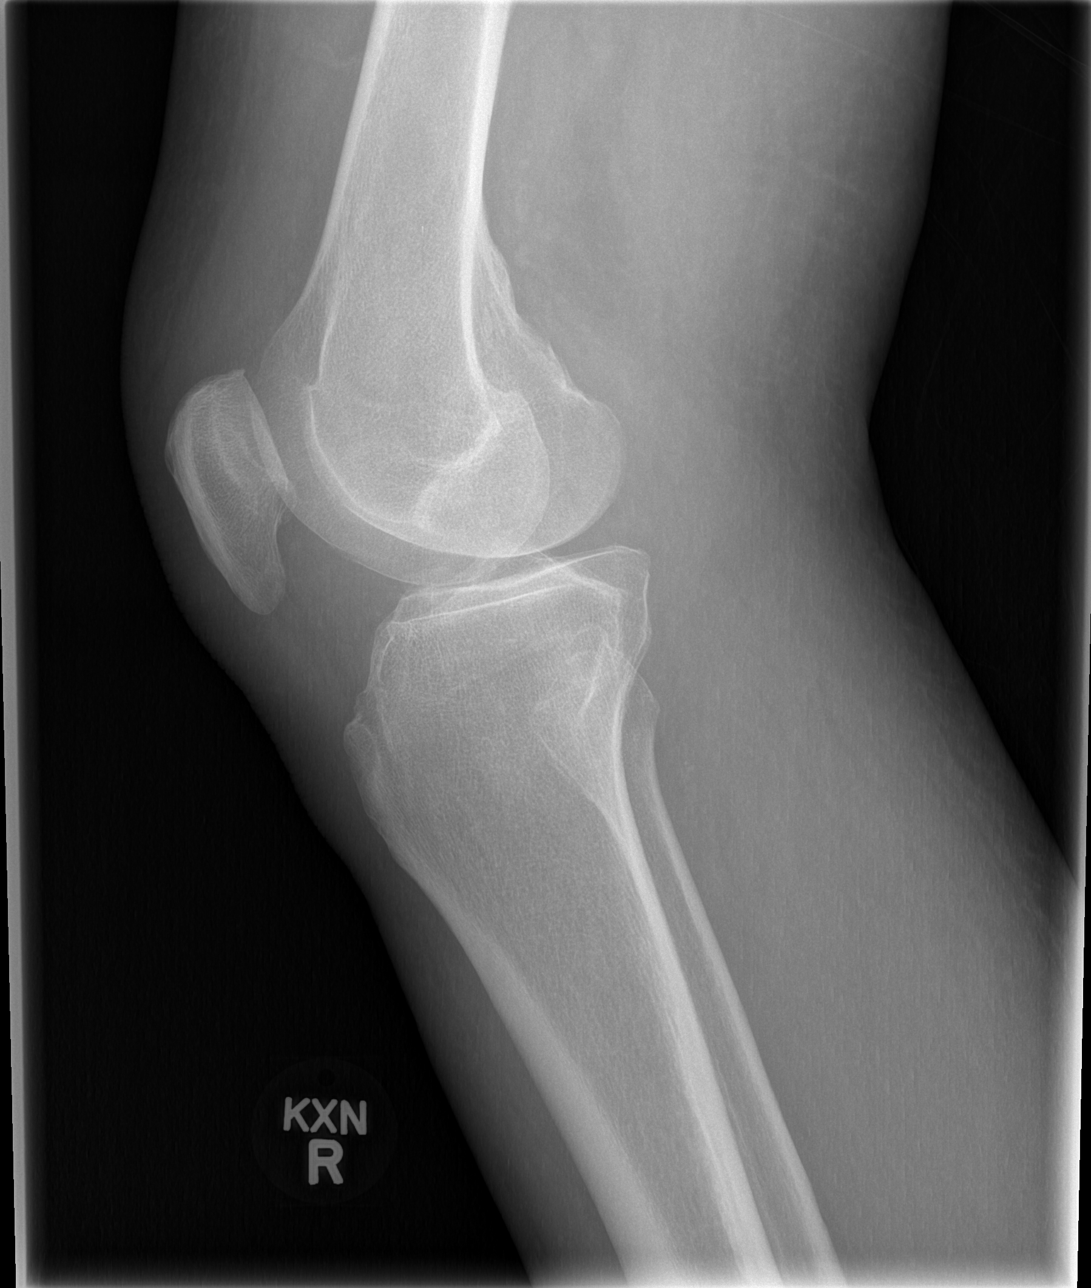

[4 of 4 positions shown; findings below may reference images not displayed]

FINDINGS: No joint effusion identified. Bone mineralization is
within normal limits.  Joint spaces preserved.  Patella intact.
Mild degenerative spurring at the lateral compartment.  No acute
fracture or dislocation.
IMPRESSION: No acute fracture or dislocation identified about the right knee.

## 2012-10-30 ENCOUNTER — Encounter: Payer: Self-pay | Admitting: Internal Medicine

## 2012-10-30 ENCOUNTER — Ambulatory Visit (INDEPENDENT_AMBULATORY_CARE_PROVIDER_SITE_OTHER): Payer: Medicare Other | Admitting: Internal Medicine

## 2012-10-30 ENCOUNTER — Other Ambulatory Visit (INDEPENDENT_AMBULATORY_CARE_PROVIDER_SITE_OTHER): Payer: Medicare Other

## 2012-10-30 VITALS — BP 112/62 | HR 55 | Temp 97.5°F | Resp 10 | Ht 67.0 in | Wt 162.0 lb

## 2012-10-30 DIAGNOSIS — E785 Hyperlipidemia, unspecified: Secondary | ICD-10-CM

## 2012-10-30 DIAGNOSIS — I2510 Atherosclerotic heart disease of native coronary artery without angina pectoris: Secondary | ICD-10-CM | POA: Diagnosis not present

## 2012-10-30 DIAGNOSIS — I10 Essential (primary) hypertension: Secondary | ICD-10-CM | POA: Diagnosis not present

## 2012-10-30 DIAGNOSIS — Z0000 Encounter for general adult medical examination without abnormal findings: Secondary | ICD-10-CM

## 2012-10-30 LAB — COMPREHENSIVE METABOLIC PANEL
ALT: 19 U/L (ref 0–53)
Albumin: 4.4 g/dL (ref 3.5–5.2)
Alkaline Phosphatase: 43 U/L (ref 39–117)
CO2: 29 mEq/L (ref 19–32)
GFR: 95.37 mL/min (ref >60.00)
Glucose, Bld: 88 mg/dL (ref 70–99)
Potassium: 4.7 mEq/L (ref 3.5–5.1)
Sodium: 134 mEq/L — ABNORMAL LOW (ref 135–145)
Total Protein: 7.4 g/dL (ref 6.0–8.3)

## 2012-10-30 LAB — HEPATIC FUNCTION PANEL
Alkaline Phosphatase: 43 U/L (ref 39–117)
Bilirubin, Direct: 0.1 mg/dL (ref 0.0–0.3)
Total Bilirubin: 0.8 mg/dL (ref 0.3–1.2)
Total Protein: 7.4 g/dL (ref 6.0–8.3)

## 2012-10-30 LAB — LIPID PANEL
Cholesterol: 134 mg/dL (ref 0–200)
LDL Cholesterol: 62 mg/dL (ref 0–99)
VLDL: 16 mg/dL (ref 0.0–40.0)

## 2012-10-30 NOTE — Progress Notes (Signed)
Subjective:    Patient ID: Spencer Best, male    DOB: 06-Mar-1933, 79 y.o.   MRN: [id number]  HPI The patient is here for annual Medicare wellness examination and management of other chronic and acute problems.  Interval history notable for an URI for which he saw Dr. Felicity Coyer. He has otherwise had a healthy year.   In Dec '11 he was evaluated for syncope - negative eval, including telemetry and MRI. He does report symptoms of positional vertigo - he will be light-headed and off balance with position change. He is concerned that this may be related to [facility] (generic) vs carotid dysautonomia.   The risk factors are reflected in the social history.  The roster of all physicians providing medical care to patient - is listed in the Snapshot section of the chart.  Activities of daily living:  The patient is 100% inedpendent in all ADLs: dressing, toileting, feeding as well as independent mobility  Home safety : The patient has smoke detectors in the home. They wear seatbelts.  firearms are present in the home, kept in a safe fashion. There is no violence in the home.   There is no risks for hepatitis, STDs or HIV. There is no   history of blood transfusion. They have no travel history to infectious disease endemic areas of the world.  The patient has seen their dentist in the last six month. They have seen their eye doctor in the last year. They deny any hearing difficulty and have not had audiologic testing in the last year.    They do not  have excessive sun exposure. Discussed the need for sun protection: hats, long sleeves and use of sunscreen if there is significant sun exposure.   Diet: the importance of a healthy diet is discussed. They do have a healthy diet.  The patient has a regular exercise program: "Y" , 60 min duration, 3 per week.  The benefits of regular aerobic exercise were discussed.  Depression screen: there are no signs or vegative symptoms of depression- irritability,  change in appetite, anhedonia, sadness/tearfullness.  Cognitive assessment: the patient manages all their financial and personal affairs and is actively engaged. They could relate day,date,year and events; recalled 3/3 objects at 3 minutes; performed clock-face test normally.  The following portions of the patient's history were reviewed and updated as appropriate: allergies, current medications, past family history, past medical history,  past surgical history, past social history  and problem list.  Past Medical History  Diagnosis Date  . CAD (coronary artery disease)   . Post PTCA   . S/P CABG (coronary artery bypass graft)   . CORONARY ARTERY BYPASS GRAFT, TWO VESSEL, HX OF   . CORONARY ARTERY DISEASE   . HEMORRHOIDS, HX OF   . HYPERLIPIDEMIA   . HYPERTENSION   . LABRYNTHITIS   . MYOCARDIAL INFARCTION, INFERIOR WALL, SUBSEQUENT CARE 06/11/2009  . PERCUTANEOUS TRANSLUMINAL CORONARY ANGIOPLASTY, HX OF 08/16/2007  . SYNCOPE, VASOVAGAL    Past Surgical History  Procedure Date  . Coronary angioplasty with stent placement   . Coronary artery bypass graft   . Correction of abdominal birth defect     at age 64  . L internal mammary artery to l anterior, saphenous vein graft    Family History  Problem Relation Age of Onset  . Coronary artery disease Father   . Osteoarthritis Father   . Parkinsonism Father   . Diverticulitis Mother   . Lung cancer Brother   .  Colon cancer Neg Hx   . Prostate cancer Neg Hx   . Arrhythmia Mother   . Rheum arthritis Father    History   Social History  . Marital Status: Married    Spouse Name: N/A    Number of Children: 2  . Years of Education: 12   Occupational History  . Full time on farm   . RETIRED    Social History Main Topics  . Smoking status: Former Smoker    Types: Cigarettes    Quit date: 06/25/1971  . Smokeless tobacco: Never Used  . Alcohol Use: Not on file  . Drug Use: Not on file  . Sexually Active: Not on file   Other  Topics Concern  . Not on file   Social History Narrative   HSG Married 1955. 1 son 81, 1 daughter 20, 3 grandchildren. Work full time on the farm, Nurse, children's. Marriage in good health. ACP - discussed with him and his wife. Referred to [email or web address]   Current Outpatient Prescriptions on File Prior to Visit  Medication Sig Dispense Refill  . aspirin 325 MG tablet Take 325 mg by mouth daily.        . Glucosamine 500 MG CAPS Take 1 capsule by mouth daily.        . nitroGLYCERIN (NITROSTAT) 0.4 MG SL tablet Place 1 tablet (0.4 mg total) under the tongue every 5 (five) minutes as needed for chest pain.  25 tablet  3  . Omega-3 Fatty Acids (FISH OIL) 1200 MG CAPS Take 1 capsule by mouth daily.        . rosuvastatin (CRESTOR) 20 MG tablet Take 1 tablet (20 mg total) by mouth daily.  30 tablet  11  . trandolapril (MAVIK) 2 MG tablet Take 1 tablet (2 mg total) by mouth daily.  30 tablet  11  . azithromycin (ZITHROMAX Z-PAK) 250 MG tablet Take 2 tablets (500 mg) on  Day 1,  followed by 1 tablet (250 mg) once daily on Days 2 through 5.  6 each  0  . benzonatate (TESSALON) 200 MG capsule Take 1 capsule (200 mg total) by mouth 3 (three) times daily as needed for cough.  30 capsule  1     Vision, hearing, body mass index were assessed and reviewed.   During the course of the visit the patient was educated and counseled about appropriate screening and preventive services including : fall prevention , diabetes screening, nutrition counseling, colorectal cancer screening, and recommended immunizations.    Review of Systems Constitutional:  Negative for fever, chills, activity change and unexpected weight change.  HEENT:  Negative for hearing loss, ear pain, congestion, neck stiffness and postnasal drip. Negative for sore throat or swallowing problems. Negative for dental complaints.   Eyes: Negative for vision loss or change in visual acuity.  Respiratory: Negative for chest  tightness and wheezing. Negative for DOE.   Cardiovascular: Negative for chest pain or palpitations. No decreased exercise tolerance Gastrointestinal: No change in bowel habit. No bloating or gas. No reflux or indigestion Genitourinary: Negative for urgency, frequency, flank pain and difficulty urinating.  Musculoskeletal: Negative for myalgias, back pain, arthralgias and gait problem.  Neurological: Negative for dizziness, tremors, weakness and headaches.  Hematological: Negative for adenopathy.  Psychiatric/Behavioral: Negative for behavioral problems and dysphoric mood.       Objective:   Physical Exam Filed Vitals:   10/30/12 1340  BP: 112/62  Pulse: 55  Temp: 97.5 F (36.4 C)  Resp:  10   Wt Readings from Last 3 Encounters:  10/30/12 162 lb (73.483 kg)  09/15/12 160 lb 6.4 oz (72.757 kg)  06/27/12 162 lb (73.483 kg)   Gen'l: Well nourished well developed white male in no acute distress  HEENT: Head: Normocephalic and atraumatic. Right Ear: External ear normal. EAC/TM nl. Left Ear: External ear normal.  EAC/TM nl. Nose: Nose normal. Mouth/Throat: Oropharynx is clear and moist. Dentition - native, in good repair. No buccal or palatal lesions. Posterior pharynx clear. Eyes: Conjunctivae and sclera clear. EOM intact. Pupils are equal, round, and reactive to light. Right eye exhibits no discharge. Left eye exhibits no discharge. Neck: Normal range of motion. Neck supple. No JVD present. No tracheal deviation present. No thyromegaly present.  Cardiovascular: Normal rate, regular rhythm, no gallop, no friction rub, no murmur heard.      Quiet precordium. 2+ radial and DP pulses . No carotid bruits Pulmonary/Chest: Effort normal. No respiratory distress or increased WOB, no wheezes, no rales. No chest wall deformity or CVAT. Abdomen: Soft. Bowel sounds are normal in all quadrants. He exhibits no distension, no tenderness, no rebound or guarding, No heptosplenomegaly  Genitourinary:  deferred  Musculoskeletal: Normal range of motion. He exhibits no edema and no tenderness.       Small and large joints without redness, synovial thickening or deformity. Full range of motion preserved about all small, median and large joints.  Lymphadenopathy:    He has no cervical or supraclavicular adenopathy.  Neurological: He is alert and oriented to person, place, and time. CN II-XII intact. DTRs 2+ and symmetrical biceps, radial and patellar tendons. Cerebellar function normal with no tremor, rigidity, normal gait and station.  Skin: Skin is warm and dry. No rash noted. No erythema.  Psychiatric: He has a normal mood and affect. His behavior is normal. Thought content normal.   Lab Results  Component Value Date   WBC 5.9 10/14/2010   HGB 12.7* 10/14/2010   HCT 37.8* 10/14/2010   PLT 163 10/14/2010   GLUCOSE 88 10/30/2012   CHOL 134 10/30/2012   TRIG 80.0 10/30/2012   HDL 55.70 10/30/2012   LDLCALC 62 10/30/2012   ALT 19 10/30/2012   ALT 19 10/30/2012   AST 23 10/30/2012   AST 23 10/30/2012   NA 134* 10/30/2012   K 4.7 10/30/2012   CL 98 10/30/2012   CREATININE 0.8 10/30/2012   BUN 13 10/30/2012   CO2 29 10/30/2012   TSH 5.064* 10/13/2010   PSA 1.05 09/23/2008   INR 1.02 10/13/2010           Assessment & Plan:

## 2012-10-30 NOTE — Patient Instructions (Addendum)
Thanks for coming in to see me.  In regard to the light-headness: it may be the [facility] or it may be carotid dysautonomia (positional vertigo). Plan Stop the [facility]  Check your Blood pressure after 2-3 days. If the SBP is less than 150, DBP less than 90, you can continue to leave it off for another 3-5 days.  You can create an opportunity to test for dizziness - do what usually causes you to feel light headed.  Your exam is normal. Lab for today. You will receive a full report including lab results. You are invited to open a MyChart account if you have access to a computer and the internet so that you can receive lab and test results.

## 2012-10-31 NOTE — Assessment & Plan Note (Signed)
BP Readings from Last 3 Encounters:  10/30/12 112/62  09/15/12 118/60  06/27/12 130/62   Excellent control. However, he is concerned that Archer Asa is the source of his light-headed episodes.  Plan  Drug holiday - see AVS. If symptoms do not abate he is to resume medication. If symptoms do abate will switch to a BB

## 2012-10-31 NOTE — Assessment & Plan Note (Signed)
Lipid panel reveals excellent control on present medication. Liver functions are normal.  Plan  Continue present medication

## 2012-10-31 NOTE — Assessment & Plan Note (Signed)
Stable w/o symptoms. He is current with Dr. Daleen Squibb.  Plan Continue present risk modification

## 2012-10-31 NOTE — Assessment & Plan Note (Signed)
Interval history is negative for any major illnes, surgery or injury. Limited physical exam is normal. Current lab results are in normal range. He is current with colorectal cancer screening and has aged out of prostate screening ([facility] April '13 guidelines). Immunizations are up to date.  In summary - a very nice, active man who is medically stable. He will continue his healthy life-style, occasional pizza included, and will try to get to the "Y" more often. He will return in 1 year or sooner as needed. He will report back the outcome of his drug holiday from [facility].

## 2013-01-16 DIAGNOSIS — H02409 Unspecified ptosis of unspecified eyelid: Secondary | ICD-10-CM | POA: Diagnosis not present

## 2013-01-16 DIAGNOSIS — H2589 Other age-related cataract: Secondary | ICD-10-CM | POA: Diagnosis not present

## 2013-01-16 DIAGNOSIS — H0289 Other specified disorders of eyelid: Secondary | ICD-10-CM | POA: Diagnosis not present

## 2013-01-16 DIAGNOSIS — H2510 Age-related nuclear cataract, unspecified eye: Secondary | ICD-10-CM | POA: Diagnosis not present

## 2013-01-16 DIAGNOSIS — H04209 Unspecified epiphora, unspecified lacrimal gland: Secondary | ICD-10-CM | POA: Diagnosis not present

## 2013-01-16 DIAGNOSIS — Z87828 Personal history of other (healed) physical injury and trauma: Secondary | ICD-10-CM | POA: Diagnosis not present

## 2013-01-16 DIAGNOSIS — Z7982 Long term (current) use of aspirin: Secondary | ICD-10-CM | POA: Diagnosis not present

## 2013-01-16 DIAGNOSIS — Z87891 Personal history of nicotine dependence: Secondary | ICD-10-CM | POA: Diagnosis not present

## 2013-05-08 DIAGNOSIS — C4441 Basal cell carcinoma of skin of scalp and neck: Secondary | ICD-10-CM | POA: Diagnosis not present

## 2013-05-08 DIAGNOSIS — L570 Actinic keratosis: Secondary | ICD-10-CM | POA: Diagnosis not present

## 2013-06-26 ENCOUNTER — Ambulatory Visit: Payer: Medicare Other | Admitting: Cardiology

## 2013-06-29 ENCOUNTER — Ambulatory Visit (INDEPENDENT_AMBULATORY_CARE_PROVIDER_SITE_OTHER): Payer: Medicare Other | Admitting: Cardiology

## 2013-06-29 ENCOUNTER — Encounter: Payer: Self-pay | Admitting: Cardiology

## 2013-06-29 VITALS — BP 123/61 | HR 43 | Ht 67.0 in | Wt 157.8 lb

## 2013-06-29 DIAGNOSIS — I2510 Atherosclerotic heart disease of native coronary artery without angina pectoris: Secondary | ICD-10-CM | POA: Diagnosis not present

## 2013-06-29 DIAGNOSIS — Z79899 Other long term (current) drug therapy: Secondary | ICD-10-CM | POA: Diagnosis not present

## 2013-06-29 DIAGNOSIS — I2119 ST elevation (STEMI) myocardial infarction involving other coronary artery of inferior wall: Secondary | ICD-10-CM | POA: Diagnosis not present

## 2013-06-29 DIAGNOSIS — I10 Essential (primary) hypertension: Secondary | ICD-10-CM | POA: Diagnosis not present

## 2013-06-29 DIAGNOSIS — R55 Syncope and collapse: Secondary | ICD-10-CM | POA: Diagnosis not present

## 2013-06-29 DIAGNOSIS — E785 Hyperlipidemia, unspecified: Secondary | ICD-10-CM

## 2013-06-29 LAB — HEPATIC FUNCTION PANEL
AST: 20 U/L (ref 0–37)
Albumin: 4.3 g/dL (ref 3.5–5.2)
Total Bilirubin: 0.7 mg/dL (ref 0.3–1.2)

## 2013-06-29 LAB — LIPID PANEL
HDL: 57.3 mg/dL (ref >39.00)
LDL Cholesterol: 65 mg/dL (ref 0–99)
Total CHOL/HDL Ratio: 2
Triglycerides: 68 mg/dL (ref 0.0–149.0)

## 2013-06-29 NOTE — Patient Instructions (Addendum)
The current medical regimen is effective;  continue present plan and medications.  Your physician has requested that you have an exercise tolerance test. For further information please visit [email or web address] Please also follow instruction sheet, as given.  Please have blood work today  (lipid and liver)  Follow up in 1 year with Dr Antoine Poche.  You will receive a letter in the mail 2 months before you are due.  Please call us when you receive this letter to schedule your follow up appointment.

## 2013-06-29 NOTE — Progress Notes (Signed)
HPI The patient presents as a new patient for me. He had previously seen Dr. Daleen Squibb. He had bypass surgery apparently in 79. He has done very well with his last nuclear study being January 2012. He is active. 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 With all of this he gets no cardiovascular symptoms.  The patient denies any new symptoms such as chest discomfort, neck or arm discomfort. There has been no new shortness of breath, PND or orthopnea. There have been no reported palpitations, presyncope or syncope.    No Known Allergies  Current Outpatient Prescriptions  Medication Sig Dispense Refill  . aspirin 325 MG tablet Take 325 mg by mouth daily.        . nitroGLYCERIN (NITROSTAT) 0.4 MG SL tablet Place 1 tablet (0.4 mg total) under the tongue every 5 (five) minutes as needed for chest pain.  25 tablet  3  . Omega-3 Fatty Acids (FISH OIL) 1200 MG CAPS Take 1 capsule by mouth daily.        . rosuvastatin (CRESTOR) 20 MG tablet Take 1 tablet (20 mg total) by mouth daily.  30 tablet  11  . trandolapril (MAVIK) 2 MG tablet Take 1 tablet (2 mg total) by mouth daily.  30 tablet  11   No current facility-administered medications for this visit.    Past Medical History  Diagnosis Date  . CORONARY ARTERY DISEASE   . HEMORRHOIDS, HX OF   . HYPERLIPIDEMIA   . HYPERTENSION   . LABRYNTHITIS   . MYOCARDIAL INFARCTION, INFERIOR WALL, SUBSEQUENT CARE 06/11/2009  . PERCUTANEOUS TRANSLUMINAL CORONARY ANGIOPLASTY, HX OF 08/16/2007  . SYNCOPE, VASOVAGAL     Past Surgical History  Procedure Laterality Date  . Coronary artery bypass graft      L internal mammary artery to L anterior, saphenous vein graft [Other]  . Correction of abdominal birth defect      Age 22    ROS:  As stated in the HPI and negative for all other systems.  PHYSICAL EXAM BP 123/61  Pulse 43  Ht 5\' 7"  (1.702 m)  Wt 157 lb 12.8 oz (71.578 kg)  BMI 24.71 kg/m2 GENERAL:  Well appearing HEENT:  Pupils equal round and  reactive, fundi not visualized, oral mucosa unremarkable NECK:  No jugular venous distention, waveform within normal limits, carotid upstroke brisk and symmetric, no bruits, no thyromegaly LYMPHATICS:  No cervical, inguinal adenopathy LUNGS:  Clear to auscultation bilaterally BACK:  No CVA tenderness CHEST:  Well healed sternotomy scar. HEART:  PMI not displaced or sustained,S1 and S2 within normal limits, no S3, no S4, no clicks, no rubs, no murmurs ABD:  Flat, positive bowel sounds normal in frequency in pitch, no bruits, no rebound, no guarding, no midline pulsatile mass, no hepatomegaly, no splenomegaly EXT:  2 plus pulses throughout, no edema, no cyanosis no clubbing SKIN:  No rashes no nodules NEURO:  Cranial nerves II through XII grossly intact, motor grossly intact throughout PSYCH:  Cognitively intact, oriented to person place and time  EKG:  Sinus bradycardia, rate 52, axis within normal limits, intervals within normal limits, no acute ST-T wave changes.  06/29/2013  ASSESSMENT AND PLAN  CAD:  The patient has known coronary disease with distant bypass grafting. He is time to screen him with a stress test. I will bring the patient back for a POET (Plain Old Exercise Test). This will allow me to screen for obstructive coronary disease, risk stratify and very importantly provide  a prescription for exercise. Of note we reviewed previous records. Though he is history limits from previous notes reports catheterizations and PCI is more recently I can only find a catheterization from 2003. The patient doesn't recall having anything more frequent. I will try to review further clarify this.  DYSLIPIDEMIA:  The patient will have a fasting lipid profile and liver enzymes.

## 2013-07-19 ENCOUNTER — Other Ambulatory Visit: Payer: Self-pay | Admitting: Cardiology

## 2013-07-20 ENCOUNTER — Other Ambulatory Visit: Payer: Self-pay | Admitting: Cardiology

## 2013-07-24 ENCOUNTER — Ambulatory Visit (INDEPENDENT_AMBULATORY_CARE_PROVIDER_SITE_OTHER): Payer: Medicare Other | Admitting: Physician Assistant

## 2013-07-24 DIAGNOSIS — I2510 Atherosclerotic heart disease of native coronary artery without angina pectoris: Secondary | ICD-10-CM | POA: Diagnosis not present

## 2013-07-24 DIAGNOSIS — R9439 Abnormal result of other cardiovascular function study: Secondary | ICD-10-CM

## 2013-07-24 NOTE — Progress Notes (Signed)
Exercise Treadmill Test  Pre-Exercise Testing Evaluation Rhythm: normal sinus  Rate: 50                 Test  Exercise Tolerance Test Ordering MD: Angelina Sheriff, MD  Interpreting MD: Tereso Newcomer, PA-C  Unique Test No: 1  Treadmill:  1  Indication for ETT: CAD  Contraindication to ETT: No   Stress Modality: exercise - treadmill  Cardiac Imaging Performed: non   Protocol: standard Bruce - maximal  Max BP:  232/63  Max MPHR (bpm):  142 85% MPR (bpm):  121  MPHR obtained (bpm):  131 % MPHR obtained:  92  Reached 85% MPHR (min:sec):  6:26 Total Exercise Time (min-sec):  9:00  Workload in METS:  10.1 Borg Scale: 14  Reason ETT Terminated:  desired heart rate attained    ST Segment Analysis At Rest: normal ST segments - no evidence of significant ST depression With Exercise: borderline ST changes  Other Information Arrhythmia:  No Angina during ETT:  absent (0) Quality of ETT:  indeterminate  ETT Interpretation:  borderline (indeterminate) with non-specific ST changes  Comments: Excellent exercise capacity. No chest pain. Hypertensive BP response to exercise. There was borderline ST depression at peak exercise.   Cannot rule out ischemia.  ST changes persisted in recovery. There was good HR recovery in 1st minute post exercise.   Recommendations: Arrange Lexiscan Myoview. Signed,  Tereso Newcomer, PA-C   07/24/2013 11:20 AM

## 2013-07-24 NOTE — Patient Instructions (Addendum)
Your physician has requested that you have a lexiscan myoview. For further information please visit [email or web address]. Please follow instruction sheet, as given.   

## 2013-08-01 ENCOUNTER — Encounter: Payer: Self-pay | Admitting: Cardiovascular Disease

## 2013-08-01 ENCOUNTER — Encounter: Payer: Self-pay | Admitting: Cardiology

## 2013-08-07 ENCOUNTER — Ambulatory Visit (HOSPITAL_COMMUNITY): Payer: Medicare Other | Attending: Cardiology | Admitting: Radiology

## 2013-08-07 VITALS — BP 122/58 | Ht 67.0 in | Wt 155.0 lb

## 2013-08-07 DIAGNOSIS — I2510 Atherosclerotic heart disease of native coronary artery without angina pectoris: Secondary | ICD-10-CM

## 2013-08-07 DIAGNOSIS — I252 Old myocardial infarction: Secondary | ICD-10-CM | POA: Insufficient documentation

## 2013-08-07 DIAGNOSIS — R55 Syncope and collapse: Secondary | ICD-10-CM | POA: Insufficient documentation

## 2013-08-07 DIAGNOSIS — I10 Essential (primary) hypertension: Secondary | ICD-10-CM | POA: Insufficient documentation

## 2013-08-07 DIAGNOSIS — Z9861 Coronary angioplasty status: Secondary | ICD-10-CM | POA: Diagnosis not present

## 2013-08-07 DIAGNOSIS — R0602 Shortness of breath: Secondary | ICD-10-CM | POA: Diagnosis not present

## 2013-08-07 DIAGNOSIS — Z87891 Personal history of nicotine dependence: Secondary | ICD-10-CM | POA: Insufficient documentation

## 2013-08-07 DIAGNOSIS — R9439 Abnormal result of other cardiovascular function study: Secondary | ICD-10-CM

## 2013-08-07 MED ORDER — TECHNETIUM TC 99M SESTAMIBI GENERIC - CARDIOLITE
33.0000 | Freq: Once | INTRAVENOUS | Status: AC | PRN
  Administered 2013-08-07: 33 via INTRAVENOUS

## 2013-08-07 MED ORDER — REGADENOSON 0.4 MG/5ML IV SOLN
0.4000 mg | Freq: Once | INTRAVENOUS | Status: AC
  Administered 2013-08-07: 0.4 mg via INTRAVENOUS

## 2013-08-07 MED ORDER — TECHNETIUM TC 99M SESTAMIBI GENERIC - CARDIOLITE
11.0000 | Freq: Once | INTRAVENOUS | Status: AC | PRN
  Administered 2013-08-07: 11 via INTRAVENOUS

## 2013-08-07 NOTE — Progress Notes (Signed)
[facility] SITE 3 NUCLEAR MED [address] [place], [place] [address] [phone]    Cardiology Nuclear Med Study  Spencer Best is a 79 y.o. male     MRN : [id number]     DOB: Mar 31, 1933  Procedure Date: 08/07/2013  Nuclear Med Background Indication for Stress Test:  Evaluation for Ischemia, Graft Patency, Stent Patency, PTCA Patency and Abnormal GXT History:  9/14 GXT, 2011 MPS Normal EF 53%, Echo EF 50-55%, 2001 PTCA, 2002 Stent, MI Cardiac Risk Factors: History of Smoking, Hypertension and Lipids  Symptoms:  Syncope   Nuclear Pre-Procedure Caffeine/Decaff Intake:  None NPO After: 8:00pm   Lungs:  clear O2 Sat: 97% on room air. IV 0.9% NS with Angio Cath:  22g  IV Site: R Hand  IV Started by:  Bonnita Levan, RN  Chest Size (in):  42 Cup Size: n/a  Height: 5\' 7"  (1.702 m)  Weight:  155 lb (70.308 kg)  BMI:  Body mass index is 24.27 kg/(m^2). Tech Comments:  N/A    Nuclear Med Study 1 or 2 day study: 1 day  Stress Test Type:  Lexiscan  Reading MD: Charlton Haws, MD  Order Authorizing Provider:  Rollene Rotunda, MD  Resting Radionuclide: Technetium 9m Sestamibi  Resting Radionuclide Dose: 11.0 mCi   Stress Radionuclide:  Technetium 63m Sestamibi  Stress Radionuclide Dose: 33.0 mCi           Stress Protocol Rest HR: 47 Stress HR: 64  Rest BP: 122/58 Stress BP: 133/55  Predicted Max HR: 142 bpm % Max HR: 45.07 bpm Rate Pressure Product: 8512   Dose of Adenosine (mg):  n/a Dose of Lexiscan: 0.4 mg  Dose of Atropine (mg): n/a Dose of Dobutamine: n/a mcg/kg/min (at max HR)  Stress Test Technologist: Milana Na, EMT-P  Nuclear Technologist:  Domenic Polite, CNMT     Rest Procedure:  Myocardial perfusion imaging was performed at rest 45 minutes following the intravenous administration of Technetium 44m Sestamibi. Rest ECG: NSR - Normal EKG  Stress Procedure:  The patient received IV Lexiscan 0.4 mg over 15-seconds.  Technetium 70m Sestamibi injected  at 30-seconds. This patient was sob with the Lexiscan injection. Quantitative spect images were obtained after a 45 minute delay. Stress ECG: No significant change from baseline ECG  QPS Raw Data Images:  Patient motion noted. Stress Images:  thinning of inferior base Rest Images:  thinning of inferior base Subtraction (SDS):  No evidence of ischemia. Transient Ischemic Dilatation (Normal <1.22):  n/a Lung/Heart Ratio (Normal <0.45):  0.28  Quantitative Gated Spect Images QGS EDV:  116 ml QGS ESV:  61 ml  Impression Exercise Capacity:  Lexiscan with low level exercise. BP Response:  Normal blood pressure response. Clinical Symptoms:  There is dyspnea. ECG Impression:  No significant ST segment change suggestive of ischemia. Comparison with Prior Nuclear Study: No images to compare  Overall Impression:  Low risk stress nuclear study Thinning of the inferior base not thought to be significant No ischemia .  LV Ejection Fraction: 48%.  LV Wall Motion:  Low normal EF.  No discrete RWMA seen    Charlton Haws

## 2013-08-08 ENCOUNTER — Encounter: Payer: Self-pay | Admitting: Physician Assistant

## 2013-08-09 ENCOUNTER — Telehealth: Payer: Self-pay | Admitting: Cardiology

## 2013-08-09 DIAGNOSIS — Z23 Encounter for immunization: Secondary | ICD-10-CM | POA: Diagnosis not present

## 2013-08-09 NOTE — Telephone Encounter (Signed)
Returning your call. °

## 2013-08-13 ENCOUNTER — Other Ambulatory Visit: Payer: Self-pay | Admitting: Cardiology

## 2013-08-13 DIAGNOSIS — H2510 Age-related nuclear cataract, unspecified eye: Secondary | ICD-10-CM | POA: Diagnosis not present

## 2013-08-13 DIAGNOSIS — H25019 Cortical age-related cataract, unspecified eye: Secondary | ICD-10-CM | POA: Diagnosis not present

## 2013-08-13 DIAGNOSIS — H524 Presbyopia: Secondary | ICD-10-CM | POA: Diagnosis not present

## 2013-08-16 ENCOUNTER — Other Ambulatory Visit: Payer: Self-pay | Admitting: Cardiology

## 2013-09-14 ENCOUNTER — Other Ambulatory Visit: Payer: Self-pay | Admitting: Cardiology

## 2013-11-27 ENCOUNTER — Other Ambulatory Visit (INDEPENDENT_AMBULATORY_CARE_PROVIDER_SITE_OTHER): Payer: Medicare Other

## 2013-11-27 ENCOUNTER — Encounter: Payer: Self-pay | Admitting: Internal Medicine

## 2013-11-27 ENCOUNTER — Ambulatory Visit (INDEPENDENT_AMBULATORY_CARE_PROVIDER_SITE_OTHER): Payer: Medicare Other | Admitting: Internal Medicine

## 2013-11-27 VITALS — BP 130/70 | HR 52 | Temp 97.5°F | Ht 67.75 in | Wt 159.0 lb

## 2013-11-27 DIAGNOSIS — I10 Essential (primary) hypertension: Secondary | ICD-10-CM | POA: Diagnosis not present

## 2013-11-27 DIAGNOSIS — Z0000 Encounter for general adult medical examination without abnormal findings: Secondary | ICD-10-CM

## 2013-11-27 DIAGNOSIS — Z23 Encounter for immunization: Secondary | ICD-10-CM

## 2013-11-27 DIAGNOSIS — E785 Hyperlipidemia, unspecified: Secondary | ICD-10-CM

## 2013-11-27 LAB — BASIC METABOLIC PANEL
BUN: 11 mg/dL (ref 6–23)
CO2: 26 meq/L (ref 19–32)
Calcium: 9.9 mg/dL (ref 8.4–10.5)
Chloride: 99 mEq/L (ref 96–112)
Creatinine, Ser: 0.9 mg/dL (ref 0.4–1.5)
GFR: 90.08 mL/min (ref >60.00)
Glucose, Bld: 89 mg/dL (ref 70–99)
POTASSIUM: 4.5 meq/L (ref 3.5–5.1)
SODIUM: 133 meq/L — AB (ref 135–145)

## 2013-11-27 LAB — LIPID PANEL
CHOL/HDL RATIO: 2
Cholesterol: 148 mg/dL (ref 0–200)
HDL: 61.8 mg/dL (ref >39.00)
LDL CALC: 71 mg/dL (ref 0–99)
TRIGLYCERIDES: 78 mg/dL (ref 0.0–149.0)
VLDL: 15.6 mg/dL (ref 0.0–40.0)

## 2013-11-27 NOTE — Progress Notes (Signed)
Pre visit review using our clinic review tool, if applicable. No additional management support is needed unless otherwise documented below in the visit note. 

## 2013-11-27 NOTE — Assessment & Plan Note (Signed)
Stable with no cardiac complaints. Current with cardiology for follow up.  Plan Continue risk modifcation.

## 2013-11-27 NOTE — Patient Instructions (Signed)
Thanks for coming to see me all these years. It has been a pleasure for me to help with your care.  Your physical exam is normal  Labs today: cholesterol panel, kidney function, sugar and electrolytes. Results will be mailed  Immunizations - current and will give Prevnar pneumonia vaccine today - once and done.

## 2013-11-27 NOTE — Progress Notes (Signed)
Subjective:     Patient ID: Spencer Best, male   DOB: [birth year], 80 y.o.   MRN: [id number]  HPI The patient is here for annual Medicare wellness examination and management of other chronic and acute problems.   The risk factors are reflected in the social history.  The roster of all physicians providing medical care to patient - is listed in the Snapshot section of the chart.  Activities of daily living:  The patient is 100% indpendent in all ADLs: dressing, toileting, feeding as well as independent mobility  Home safety : The patient has smoke detectors in the home. They wear seatbelts.Firearms are present in the home, kept in a safe fashion. There is no violence in the home.   There is no risks for hepatitis, STDs or HIV. There is no   history of blood transfusion. They have no travel history to infectious disease endemic areas of the world.  The patient sees the dentist every 6-12 months. They have seen their eye doctor in the last year. They deny any hearing difficulty and have not had audiologic testing in the last year.  They do have excessive sun exposure. Discussed the need for sun protection: hats, long sleeves and use of sunscreen if there is significant sun exposure.   Diet: the importance of a healthy diet is discussed. They do have a healthy diet, eating out more often in the last several years. Patient was encouraged to make healthy dietary choices.   The patient has a regular exercise program: strength training, treadmill, 1.5 hours duration, 3 per week.  The benefits of regular aerobic exercise were discussed.  Depression screen: there are no signs or vegative symptoms of depression- irritability, change in appetite, anhedonia, sadness/tearfullness.  Cognitive assessment: the patient manages all their financial and personal affairs and is actively engaged. They could relate day,date,year and events; recalled 3/3 objects at 3 minutes; performed clock-face test  normally.  The following portions of the patient's history were reviewed and updated as appropriate: allergies, current medications, past family history, past medical history,  past surgical history, past social history  and problem list.  Vision, hearing, body mass index were assessed and reviewed.   During the course of the visit the patient was educated and counseled about appropriate screening and preventive services including : fall prevention , diabetes screening, nutrition counseling, colorectal cancer screening, and recommended immunizations. Past Medical History  Diagnosis Date  . CORONARY ARTERY DISEASE     Lexiscan Myoview (10/14):  Low risk, inf thinning, no ischemia, EF 48%  . HEMORRHOIDS, HX OF   . HYPERLIPIDEMIA   . HYPERTENSION   . LABRYNTHITIS   . MYOCARDIAL INFARCTION, INFERIOR WALL, SUBSEQUENT CARE 06/11/2009  . PERCUTANEOUS TRANSLUMINAL CORONARY ANGIOPLASTY, HX OF 08/16/2007  . SYNCOPE, VASOVAGAL    Past Surgical History  Procedure Laterality Date  . Coronary artery bypass graft      L internal mammary artery to L anterior, saphenous vein graft [Other]  . Correction of abdominal birth defect      Age 53   Family History  Problem Relation Age of Onset  . Coronary artery disease Father   . Osteoarthritis Father   . Parkinsonism Father   . Diverticulitis Mother   . Lung cancer Brother   . Colon cancer Neg Hx   . Prostate cancer Neg Hx   . Arrhythmia Mother   . Rheum arthritis Father    History   Social History  . Marital Status: Married  Spouse Name: N/A    Number of Children: 2  . Years of Education: 12   Occupational History  . Full time on farm   . RETIRED    Social History Main Topics  . Smoking status: Former Smoker    Types: Cigarettes    Quit date: 06/25/1971  . Smokeless tobacco: Never Used  . Alcohol Use: No  . Drug Use: No  . Sexual Activity: Not on file   Other Topics Concern  . Not on file   Social History Narrative   HSG  Married 1955. 1 son 68, 1 daughter 67, 3 grandchildren. Work full time on the farm, Manufacturing engineer. Marriage in good health. ACP - discussed with him and his wife. Referred to [email or web address].   Current Outpatient Prescriptions on File Prior to Visit  Medication Sig Dispense Refill  . aspirin 325 MG tablet Take 325 mg by mouth daily.        . CRESTOR 20 MG tablet TAKE ONE TABLET BY MOUTH ONCE DAILY  30 tablet  5  . nitroGLYCERIN (NITROSTAT) 0.4 MG SL tablet Place 1 tablet (0.4 mg total) under the tongue every 5 (five) minutes as needed for chest pain.  25 tablet  3  . Omega-3 Fatty Acids (FISH OIL) 1200 MG CAPS Take 1 capsule by mouth daily.        . trandolapril (MAVIK) 2 MG tablet TAKE ONE TABLET BY MOUTH ONCE DAILY  30 tablet  5   No current facility-administered medications on file prior to visit.      Review of Systems  Constitutional:  Negative for fever, chills, activity change and unexpected weight change.  HEENT:  Negaive for ear pain, congestion, neck stiffness and postnasal drip. Negative for sore throat or swallowing problems. Negative for dental complaints.  Endorses some hearing loss, but no recent changes.  Eyes: Negative for vision loss or change in visual acuity.  Respiratory: Negative for chest tightness and wheezing. Negative for DOE.   Cardiovascular: Negative for chest pain or palpitations. No decreased exercise tolerance Gastrointestinal: No change in bowel habit. No bloating or gas. No reflux or indigestion Genitourinary: Negative for urgency, frequency, flank pain and difficulty urinating.  Musculoskeletal: Negative for myalgias, back pain, arthralgias and gait problem.  Neurological: Negative for dizziness, tremors, weakness and headaches.  Hematological: Negative for adenopathy.  Psychiatric/Behavioral: Negative for behavioral problems and dysphoric mood.       Objective:   Physical Exam Filed Vitals:   11/27/13 0917  BP: 130/70  Pulse:  52  Temp: 97.5 F (36.4 C)   Wt Readings from Last 3 Encounters:  11/27/13 159 lb (72.122 kg)  08/07/13 155 lb (70.308 kg)  06/29/13 157 lb 12.8 oz (71.578 kg)   Gen'l: Well nourished well developed man in no acute distress  HEENT: Head: Normocephalic and atraumatic. Right Ear: External ear normal. EAC/TM nl. Left Ear: External ear normal.  EAC/TM nl. Nose: Erythematous skin. Mouth/Throat: Oropharynx is clear and moist. Dentition - native, in good repair. No buccal or palatal lesions. Posterior pharynx clear. Eyes: Conjunctivae and sclera clear. EOM intact. Pupils are equal, round, and reactive to light. Right eye exhibits no discharge. Left eye exhibits no discharge. Neck: Normal range of motion. Neck supple. No JVD present. No tracheal deviation present. No thyromegaly present.  Cardiovascular: Normal rate, regular rhythm, no gallop, no friction rub, no murmur heard.      Quiet precordium. 2+ radial and DP pulses . No carotid bruits Pulmonary/Chest: Effort normal.  No respiratory distress or increased WOB, no wheezes, no rales. No chest wall deformity or CVAT. Abdomen: Soft. Bowel sounds are normal in all quadrants. He exhibits no distension, no tenderness, no rebound or guarding, No heptosplenomegaly  Genitourinary:   Musculoskeletal: Normal range of motion. He exhibits no edema and no tenderness.    Lymphadenopathy:    He has no cervical or supraclavicular adenopathy.  Neurological: He is alert and oriented to person, place, and time. CN II-XII intact. DTRs 2+ and symmetrical biceps, radial and patellar tendons. Cerebellar function normal with no tremor, rigidity, normal gait and station.  Skin: Skin is warm and dry. No rash noted. No erythema.  Psychiatric: He has a normal mood and affect. His behavior is normal. Thought content normal.   Recent Results (from the past 2160 hour(s))  LIPID PANEL     Status: None   Collection Time    11/27/13 11:14 AM      Result Value Range    Cholesterol 148  0 - 200 mg/dL   Comment: ATP III Classification       Desirable:  < 200 mg/dL               Borderline High:  200 - 239 mg/dL          High:  > = 240 mg/dL   Triglycerides 78.0  0.0 - 149.0 mg/dL   Comment: Normal:  <150 mg/dLBorderline High:  150 - 199 mg/dL   HDL 61.80  >39.00 mg/dL   VLDL 15.6  0.0 - 40.0 mg/dL   LDL Cholesterol 71  0 - 99 mg/dL   Total CHOL/HDL Ratio 2     Comment:                Men          Women1/2 Average Risk     3.4          3.3Average Risk          5.0          4.42X Average Risk          9.6          7.13X Average Risk          15.0          11.0                      BASIC METABOLIC PANEL     Status: Abnormal   Collection Time    11/27/13 11:14 AM      Result Value Range   Sodium 133 (*) 135 - 145 mEq/L   Potassium 4.5  3.5 - 5.1 mEq/L   Chloride 99  96 - 112 mEq/L   CO2 26  19 - 32 mEq/L   Glucose, Bld 89  70 - 99 mg/dL   BUN 11  6 - 23 mg/dL   Creatinine, Ser 0.9  0.4 - 1.5 mg/dL   Calcium 9.9  8.4 - 10.5 mg/dL   GFR 90.08  >60.00 mL/min     Assessment:     This is a 80 year old man with a past medical history significant for MI, hypertension, dyslipidemia, who presents for an annual physical exam.      Plan:     1. Prevnar13-valent vaccine today  2. Lab collect BMP, cholesterol panel  3. Skin protection in the sun   5. Follow up in one year.     Patient seen,  interviewed and examined. Agree with assessment above and plans moving forward.

## 2013-11-27 NOTE — Assessment & Plan Note (Signed)
BP Readings from Last 3 Encounters:  11/27/13 130/70  08/07/13 122/58  06/29/13 123/61   Patient well controlled on present medication  Plan continue present regimen

## 2013-11-27 NOTE — Assessment & Plan Note (Signed)
Interval history is unremarkable, no major illness, surgery or injury. Physical exam is normal. Labs ordered and pending. He is current with colorectal cancer screening. Immunizations are up to date and Prevnar is given today.  In summary A delightful man who is healthy, active and medically stable. He will return for routine exam in 1 year otherwise as needed.

## 2013-11-27 NOTE — Assessment & Plan Note (Signed)
Taking and tolerating "STatin" therapy. Due for routine follow up lab with recommendations to follow.

## 2013-11-30 ENCOUNTER — Encounter: Payer: Self-pay | Admitting: Internal Medicine

## 2014-03-15 ENCOUNTER — Other Ambulatory Visit: Payer: Self-pay | Admitting: Cardiology

## 2014-05-07 DIAGNOSIS — R609 Edema, unspecified: Secondary | ICD-10-CM | POA: Diagnosis not present

## 2014-05-07 DIAGNOSIS — M79609 Pain in unspecified limb: Secondary | ICD-10-CM | POA: Diagnosis not present

## 2014-05-07 DIAGNOSIS — T2200XA Burn of unspecified degree of shoulder and upper limb, except wrist and hand, unspecified site, initial encounter: Secondary | ICD-10-CM | POA: Diagnosis not present

## 2014-05-07 DIAGNOSIS — R937 Abnormal findings on diagnostic imaging of other parts of musculoskeletal system: Secondary | ICD-10-CM | POA: Diagnosis not present

## 2014-05-07 DIAGNOSIS — M7120 Synovial cyst of popliteal space [Baker], unspecified knee: Secondary | ICD-10-CM | POA: Diagnosis not present

## 2014-05-15 DIAGNOSIS — L02419 Cutaneous abscess of limb, unspecified: Secondary | ICD-10-CM | POA: Diagnosis not present

## 2014-05-23 DIAGNOSIS — L02419 Cutaneous abscess of limb, unspecified: Secondary | ICD-10-CM | POA: Diagnosis not present

## 2014-05-23 DIAGNOSIS — L03119 Cellulitis of unspecified part of limb: Secondary | ICD-10-CM | POA: Diagnosis not present

## 2014-06-10 DIAGNOSIS — L02419 Cutaneous abscess of limb, unspecified: Secondary | ICD-10-CM | POA: Diagnosis not present

## 2014-06-11 ENCOUNTER — Other Ambulatory Visit: Payer: Self-pay | Admitting: Cardiology

## 2014-07-03 ENCOUNTER — Ambulatory Visit (INDEPENDENT_AMBULATORY_CARE_PROVIDER_SITE_OTHER): Payer: Medicare Other | Admitting: Cardiology

## 2014-07-03 VITALS — BP 120/60 | HR 51 | Ht 67.0 in | Wt 158.0 lb

## 2014-07-03 DIAGNOSIS — I25810 Atherosclerosis of coronary artery bypass graft(s) without angina pectoris: Secondary | ICD-10-CM | POA: Diagnosis not present

## 2014-07-03 NOTE — Progress Notes (Signed)
HPI The patient presents for follow up of CAD with bypass surgery in 1994. He has done very well since I last saw him.  He had a POET (Plain Old Exercise Treadmill) last year which was normal.   He is active. 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 With all of this he gets no cardiovascular symptoms.  The patient denies any new symptoms such as chest discomfort, neck or arm discomfort. There has been no new shortness of breath, PND or orthopnea. There have been no reported palpitations, presyncope or syncope.    No Known Allergies  Current Outpatient Prescriptions  Medication Sig Dispense Refill  . aspirin 325 MG tablet Take 325 mg by mouth daily.        . CRESTOR 20 MG tablet TAKE ONE TABLET BY MOUTH DAILY  30 tablet  0  . nitroGLYCERIN (NITROSTAT) 0.4 MG SL tablet Place 1 tablet (0.4 mg total) under the tongue every 5 (five) minutes as needed for chest pain.  25 tablet  3  . Omega-3 Fatty Acids (FISH OIL) 1200 MG CAPS Take 1 capsule by mouth daily.        . trandolapril (MAVIK) 2 MG tablet TAKE ONE TABLET BY MOUTH DAILY  30 tablet  0   No current facility-administered medications for this visit.    Past Medical History  Diagnosis Date  . CORONARY ARTERY DISEASE     Lexiscan Myoview (10/14):  Low risk, inf thinning, no ischemia, EF 48%  . HEMORRHOIDS, HX OF   . HYPERLIPIDEMIA   . HYPERTENSION   . LABRYNTHITIS   . MYOCARDIAL INFARCTION, INFERIOR WALL, SUBSEQUENT CARE 06/11/2009  . PERCUTANEOUS TRANSLUMINAL CORONARY ANGIOPLASTY, HX OF 08/16/2007  . SYNCOPE, VASOVAGAL     Past Surgical History  Procedure Laterality Date  . Coronary artery bypass graft      L internal mammary artery to L anterior, saphenous vein graft [Other]  . Correction of abdominal birth defect      Age 80    ROS:  As stated in the HPI and negative for all other systems.  PHYSICAL EXAM BP 120/60  Pulse 51  Ht 5\' 7"  (1.702 m)  Wt 158 lb (71.668 kg)  BMI 24.74 kg/m2 GENERAL:  Well appearing HEENT:   Pupils equal round and reactive, fundi not visualized, oral mucosa unremarkable NECK:  No jugular venous distention, waveform within normal limits, carotid upstroke brisk and symmetric, no bruits, no thyromegaly LYMPHATICS:  No cervical, inguinal adenopathy LUNGS:  Clear to auscultation bilaterally BACK:  No CVA tenderness CHEST:  Well healed sternotomy scar. HEART:  PMI not displaced or sustained,S1 and S2 within normal limits, no S3, no S4, no clicks, no rubs, no murmurs ABD:  Flat, positive bowel sounds normal in frequency in pitch, no bruits, no rebound, no guarding, no midline pulsatile mass, no hepatomegaly, no splenomegaly EXT:  2 plus pulses throughout, no edema, no cyanosis no clubbing SKIN:  No rashes no nodules NEURO:  Cranial nerves II through XII grossly intact, motor grossly intact throughout PSYCH:  Cognitively intact, oriented to person place and time  EKG:  Sinus bradycardia, rate 51, axis within normal limits, intervals within normal limits, no acute ST-T wave changes.  07/03/2014  ASSESSMENT AND PLAN  CAD:  The patient has no new sypmtoms.  No further cardiovascular testing is indicated.  We will continue with aggressive risk reduction and meds as listed.  DYSLIPIDEMIA:    He will remain on the meds as listed.  Lab Results  Component Value Date   CHOL 148 11/27/2013   TRIG 78.0 11/27/2013   HDL 61.80 11/27/2013   LDLCALC 71 11/27/2013

## 2014-07-03 NOTE — Patient Instructions (Signed)
Your physician recommends that you schedule a follow-up appointment in:  One year with Dr. Hochrein  

## 2014-07-13 ENCOUNTER — Other Ambulatory Visit: Payer: Self-pay | Admitting: Cardiology

## 2014-08-05 DIAGNOSIS — Z23 Encounter for immunization: Secondary | ICD-10-CM | POA: Diagnosis not present

## 2014-10-01 DIAGNOSIS — L570 Actinic keratosis: Secondary | ICD-10-CM | POA: Diagnosis not present

## 2014-10-01 DIAGNOSIS — D225 Melanocytic nevi of trunk: Secondary | ICD-10-CM | POA: Diagnosis not present

## 2014-10-01 DIAGNOSIS — D0439 Carcinoma in situ of skin of other parts of face: Secondary | ICD-10-CM | POA: Diagnosis not present

## 2014-10-01 DIAGNOSIS — X32XXXD Exposure to sunlight, subsequent encounter: Secondary | ICD-10-CM | POA: Diagnosis not present

## 2014-10-23 DIAGNOSIS — H2513 Age-related nuclear cataract, bilateral: Secondary | ICD-10-CM | POA: Diagnosis not present

## 2014-10-23 DIAGNOSIS — H5203 Hypermetropia, bilateral: Secondary | ICD-10-CM | POA: Diagnosis not present

## 2014-10-30 DIAGNOSIS — Z08 Encounter for follow-up examination after completed treatment for malignant neoplasm: Secondary | ICD-10-CM | POA: Diagnosis not present

## 2014-10-30 DIAGNOSIS — X32XXXD Exposure to sunlight, subsequent encounter: Secondary | ICD-10-CM | POA: Diagnosis not present

## 2014-10-30 DIAGNOSIS — M7541 Impingement syndrome of right shoulder: Secondary | ICD-10-CM | POA: Diagnosis not present

## 2014-10-30 DIAGNOSIS — Z85820 Personal history of malignant melanoma of skin: Secondary | ICD-10-CM | POA: Diagnosis not present

## 2014-10-30 DIAGNOSIS — L570 Actinic keratosis: Secondary | ICD-10-CM | POA: Diagnosis not present

## 2014-10-30 DIAGNOSIS — S0031XA Abrasion of nose, initial encounter: Secondary | ICD-10-CM | POA: Diagnosis not present

## 2014-11-09 ENCOUNTER — Encounter (HOSPITAL_COMMUNITY): Payer: Self-pay | Admitting: Emergency Medicine

## 2014-11-09 ENCOUNTER — Emergency Department (HOSPITAL_COMMUNITY)
Admission: EM | Admit: 2014-11-09 | Discharge: 2014-11-09 | Disposition: A | Discharge status: Home / Self Care | Payer: Medicare Other | Attending: Emergency Medicine | Admitting: Emergency Medicine

## 2014-11-09 DIAGNOSIS — Z8719 Personal history of other diseases of the digestive system: Secondary | ICD-10-CM | POA: Diagnosis not present

## 2014-11-09 DIAGNOSIS — Z7982 Long term (current) use of aspirin: Secondary | ICD-10-CM | POA: Diagnosis not present

## 2014-11-09 DIAGNOSIS — Z8669 Personal history of other diseases of the nervous system and sense organs: Secondary | ICD-10-CM | POA: Diagnosis not present

## 2014-11-09 DIAGNOSIS — Z79899 Other long term (current) drug therapy: Secondary | ICD-10-CM | POA: Diagnosis not present

## 2014-11-09 DIAGNOSIS — R55 Syncope and collapse: Secondary | ICD-10-CM | POA: Insufficient documentation

## 2014-11-09 DIAGNOSIS — E871 Hypo-osmolality and hyponatremia: Secondary | ICD-10-CM | POA: Diagnosis not present

## 2014-11-09 DIAGNOSIS — I252 Old myocardial infarction: Secondary | ICD-10-CM | POA: Diagnosis not present

## 2014-11-09 DIAGNOSIS — I10 Essential (primary) hypertension: Secondary | ICD-10-CM | POA: Insufficient documentation

## 2014-11-09 DIAGNOSIS — Z951 Presence of aortocoronary bypass graft: Secondary | ICD-10-CM | POA: Insufficient documentation

## 2014-11-09 DIAGNOSIS — I2510 Atherosclerotic heart disease of native coronary artery without angina pectoris: Secondary | ICD-10-CM | POA: Insufficient documentation

## 2014-11-09 DIAGNOSIS — Z9861 Coronary angioplasty status: Secondary | ICD-10-CM | POA: Diagnosis not present

## 2014-11-09 DIAGNOSIS — E785 Hyperlipidemia, unspecified: Secondary | ICD-10-CM | POA: Diagnosis not present

## 2014-11-09 DIAGNOSIS — R61 Generalized hyperhidrosis: Secondary | ICD-10-CM | POA: Diagnosis not present

## 2014-11-09 DIAGNOSIS — Z87891 Personal history of nicotine dependence: Secondary | ICD-10-CM | POA: Insufficient documentation

## 2014-11-09 LAB — COMPREHENSIVE METABOLIC PANEL
ALK PHOS: 56 U/L (ref 39–117)
ALT: 22 U/L (ref 0–53)
AST: 23 U/L (ref 0–37)
Albumin: 4.1 g/dL (ref 3.5–5.2)
Anion gap: 7 (ref 5–15)
BUN: 16 mg/dL (ref 6–23)
CALCIUM: 9.1 mg/dL (ref 8.4–10.5)
CO2: 27 mmol/L (ref 19–32)
CREATININE: 0.9 mg/dL (ref 0.50–1.35)
Chloride: 97 mEq/L (ref 96–112)
GFR calc Af Amer: >90 mL/min (ref >90)
GFR calc non Af Amer: 79 mL/min — ABNORMAL LOW (ref >90)
Glucose, Bld: 99 mg/dL (ref 70–99)
POTASSIUM: 4.7 mmol/L (ref 3.5–5.1)
Sodium: 131 mmol/L — ABNORMAL LOW (ref 135–145)
TOTAL PROTEIN: 7.2 g/dL (ref 6.0–8.3)
Total Bilirubin: 0.4 mg/dL (ref 0.3–1.2)

## 2014-11-09 LAB — CBG MONITORING, ED: Glucose-Capillary: 71 mg/dL (ref 70–99)

## 2014-11-09 LAB — CBC WITH DIFFERENTIAL/PLATELET
BASOS ABS: 0.1 10*3/uL (ref 0.0–0.1)
BASOS PCT: 1 % (ref 0–1)
Eosinophils Absolute: 0.4 10*3/uL (ref 0.0–0.7)
Eosinophils Relative: 5 % (ref 0–5)
HCT: 40.7 % (ref 39.0–52.0)
HEMOGLOBIN: 13.6 g/dL (ref 13.0–17.0)
LYMPHS PCT: 16 % (ref 12–46)
Lymphs Abs: 1.4 10*3/uL (ref 0.7–4.0)
MCH: 29.9 pg (ref 26.0–34.0)
MCHC: 33.4 g/dL (ref 30.0–36.0)
MCV: 89.5 fL (ref 78.0–100.0)
Monocytes Absolute: 0.9 10*3/uL (ref 0.1–1.0)
Monocytes Relative: 10 % (ref 3–12)
Neutro Abs: 5.9 10*3/uL (ref 1.7–7.7)
Neutrophils Relative %: 68 % (ref 43–77)
PLATELETS: 215 10*3/uL (ref 150–400)
RBC: 4.55 MIL/uL (ref 4.22–5.81)
RDW: 13.3 % (ref 11.5–15.5)
WBC: 8.6 10*3/uL (ref 4.0–10.5)

## 2014-11-09 LAB — I-STAT TROPONIN, ED: TROPONIN I, POC: 0 ng/mL (ref 0.00–0.08)

## 2014-11-09 MED ORDER — SODIUM CHLORIDE 0.9 % IV BOLUS (SEPSIS)
500.0000 mL | Freq: Once | INTRAVENOUS | Status: AC
  Administered 2014-11-09: 500 mL via INTRAVENOUS

## 2014-11-09 NOTE — ED Provider Notes (Signed)
CSN: [id number]     Arrival date & time 11/09/14  0557 History   First MD Initiated Contact with Patient 11/09/14 [phone]     Chief Complaint  Patient presents with  . Loss of Consciousness     Patient is a 81 y.o. male presenting with syncope. The history is provided by the patient and the spouse. No language interpreter was used.  Loss of Consciousness  Mr. Maye presents for evaluation of syncope. He went to use the bathroom about 4 in the morning to urinate. He had no preceding symptoms and was found on the ground by his wife. He had fallen backwards into some clothes and was unresponsive for a couple seconds. When his wife found him he was diaphoretic. He denied any preceding chest pain, shortness of breath, abdominal pain. He denies any recent illnesses. He denies any vomiting, fevers, diarrhea, abdominal pain, leg swelling or pain. He has a history of CABG 20 years ago. He currently has no complaints. He is able to ambulate without difficulty. He had no head injury.  Past Medical History  Diagnosis Date  . CORONARY ARTERY DISEASE     Lexiscan Myoview (10/14):  Low risk, inf thinning, no ischemia, EF 48%  . HEMORRHOIDS, HX OF   . HYPERLIPIDEMIA   . HYPERTENSION   . LABRYNTHITIS   . MYOCARDIAL INFARCTION, INFERIOR WALL, SUBSEQUENT CARE 06/11/2009  . PERCUTANEOUS TRANSLUMINAL CORONARY ANGIOPLASTY, HX OF 08/16/2007  . SYNCOPE, VASOVAGAL    Past Surgical History  Procedure Laterality Date  . Coronary artery bypass graft      L internal mammary artery to L anterior, saphenous vein graft [Other]  . Correction of abdominal birth defect      Age 75   Family History  Problem Relation Age of Onset  . Coronary artery disease Father   . Osteoarthritis Father   . Parkinsonism Father   . Diverticulitis Mother   . Lung cancer Brother   . Colon cancer Neg Hx   . Prostate cancer Neg Hx   . Arrhythmia Mother   . Rheum arthritis Father    History  Substance Use Topics  . Smoking status:  Former Smoker    Types: Cigarettes    Quit date: 06/25/1971  . Smokeless tobacco: Never Used  . Alcohol Use: No    Review of Systems  Cardiovascular: Positive for syncope.  All other systems reviewed and are negative.     Allergies  Review of patient's allergies indicates no known allergies.  Home Medications   Prior to Admission medications   Medication Sig Start Date End Date Taking? Authorizing Provider  aspirin 325 MG tablet Take 325 mg by mouth daily.      Historical Provider, MD  CRESTOR 20 MG tablet TAKE ONE TABLET BY MOUTH EVERY DAY 07/15/14   Minus Breeding, MD  nitroGLYCERIN (NITROSTAT) 0.4 MG SL tablet Place 1 tablet (0.4 mg total) under the tongue every 5 (five) minutes as needed for chest pain. 06/27/12   Renella Cunas, MD  Omega-3 Fatty Acids (FISH OIL) 1200 MG CAPS Take 1 capsule by mouth daily.      Historical Provider, MD  trandolapril (MAVIK) 2 MG tablet TAKE ONE TABLET BY MOUTH EVERY DAY 07/15/14   Minus Breeding, MD   BP 128/54 mmHg  Pulse 58  Temp(Src) 97.5 F (36.4 C) (Oral)  Resp 16  Ht 5\' 7"  (1.702 m)  Wt 154 lb (69.854 kg)  BMI 24.11 kg/m2  SpO2 97% Physical Exam  Constitutional:  He is oriented to person, place, and time. He appears well-developed and well-nourished.  HENT:  Head: Normocephalic and atraumatic.  Cardiovascular: Normal rate and regular rhythm.   No murmur heard. Pulmonary/Chest: Effort normal and breath sounds normal. No respiratory distress.  Abdominal: Soft. There is no tenderness. There is no rebound and no guarding.  Musculoskeletal: He exhibits no edema or tenderness.  Neurological: He is alert and oriented to person, place, and time.  Skin: Skin is warm and dry.  Psychiatric: He has a normal mood and affect. His behavior is normal.  Nursing note and vitals reviewed.   ED Course  Procedures (including critical care time) Labs Review Labs Reviewed  COMPREHENSIVE METABOLIC PANEL - Abnormal; Notable for the following:     Sodium 131 (*)    GFR calc non Af Amer 79 (*)    All other components within normal limits  CBC WITH DIFFERENTIAL  CBG MONITORING, ED  I-STAT TROPOININ, ED    Imaging Review No results found.   EKG Interpretation   Date/Time:  Saturday November 09 2014 06:05:49 EST Ventricular Rate:  58 PR Interval:  187 QRS Duration: 103 QT Interval:  455 QTC Calculation: 447 R Axis:   87 Text Interpretation:  Sinus rhythm Borderline right axis deviation  Anteroseptal infarct, old No significant change since last tracing  Confirmed by Glynn Octave [phone]) on 11/09/2014 6:10:13 AM      MDM   Final diagnoses:  Syncope, unspecified syncope type  Hyponatremia    Patient here for evaluation of syncopal event that occurred while he was urinating. Patient is asymptomatic in the emergency department. Clinical picture is not consistent with subarachnoid hemorrhage or PE. This is likely micturition syncope, but given patient's extensive cardiac history recommended cardiac evaluation. Patient declines observation admission for cardiac evaluation, discussed with patient close cardiology follow-up and as well as return precautions. BMP with mild hyponatremia, slightly worse than priors, patient given some normal saline in the emergency department with planned outpatient follow-up for recheck.    Quintella Reichert, MD 11/09/14 [phone]

## 2014-11-09 NOTE — ED Notes (Signed)
Pt up to bathroom without assistance, denies dizziness, tolerated without difficulty.

## 2014-11-09 NOTE — ED Notes (Signed)
Pt reports he woke up this morning to use the bathroom, pt reports he "fell out" while ambulating to the bathroom. Pt reports he did not hit his head, pt's wife reports the pt was only "out" for a few seconds however the pt was very sweaty right after he fell. Wife reports she took his BP and it was lower than normal (115/60). Pt A&O X4. Syncopal episode happened about two hours ago per pt.

## 2014-11-09 NOTE — ED Notes (Signed)
Pt CBG result 71. Informed RN.

## 2014-11-09 NOTE — Discharge Instructions (Signed)
Your Sodium was low today, please get this rechecked in the next week.      Syncope Syncope is a medical term for fainting or passing out. This means you lose consciousness and drop to the ground. People are generally unconscious for less than 5 minutes. You may have some muscle twitches for up to 15 seconds before waking up and returning to normal. Syncope occurs more often in older adults, but it can happen to anyone. While most causes of syncope are not dangerous, syncope can be a sign of a serious medical problem. It is important to seek medical care.  CAUSES  Syncope is caused by a sudden drop in blood flow to the brain. The specific cause is often not determined. Factors that can bring on syncope include:  Taking medicines that lower blood pressure.  Sudden changes in posture, such as standing up quickly.  Taking more medicine than prescribed.  Standing in one place for too long.  Seizure disorders.  Dehydration and excessive exposure to heat.  Low blood sugar (hypoglycemia).  Straining to have a bowel movement.  Heart disease, irregular heartbeat, or other circulatory problems.  Fear, emotional distress, seeing blood, or severe pain. SYMPTOMS  Right before fainting, you may:  Feel dizzy or light-headed.  Feel nauseous.  See all white or all black in your field of vision.  Have cold, clammy skin. DIAGNOSIS  Your health care provider will ask about your symptoms, perform a physical exam, and perform an electrocardiogram (ECG) to record the electrical activity of your heart. Your health care provider may also perform other heart or blood tests to determine the cause of your syncope which may include:  Transthoracic echocardiogram (TTE). During echocardiography, sound waves are used to evaluate how blood flows through your heart.  Transesophageal echocardiogram (TEE).  Cardiac monitoring. This allows your health care provider to monitor your heart rate and rhythm in  real time.  Holter monitor. This is a portable device that records your heartbeat and can help diagnose heart arrhythmias. It allows your health care provider to track your heart activity for several days, if needed.  Stress tests by exercise or by giving medicine that makes the heart beat faster. TREATMENT  In most cases, no treatment is needed. Depending on the cause of your syncope, your health care provider may recommend changing or stopping some of your medicines. HOME CARE INSTRUCTIONS  Have someone stay with you until you feel stable.  Do not drive, use machinery, or play sports until your health care provider says it is okay.  Keep all follow-up appointments as directed by your health care provider.  Lie down right away if you start feeling like you might faint. Breathe deeply and steadily. Wait until all the symptoms have passed.  Drink enough fluids to keep your urine clear or pale yellow.  If you are taking blood pressure or heart medicine, get up slowly and take several minutes to sit and then stand. This can reduce dizziness. SEEK IMMEDIATE MEDICAL CARE IF:   You have a severe headache.  You have unusual pain in the chest, abdomen, or back.  You are bleeding from your mouth or rectum, or you have black or tarry stool.  You have an irregular or very fast heartbeat.  You have pain with breathing.  You have repeated fainting or seizure-like jerking during an episode.  You faint when sitting or lying down.  You have confusion.  You have trouble walking.  You have severe weakness.  You have vision problems. If you fainted, call your local emergency services (911 in [place] Do not drive yourself to the hospital.  MAKE SURE YOU:  Understand these instructions.  Will watch your condition.  Will get help right away if you are not doing well or get worse. Document Released: 10/11/2005 Document Revised: 10/16/2013 Document Reviewed: 12/10/2011 [facility] Patient  Information 2015 [place], [place]. This information is not intended to replace advice given to you by your health care provider. Make sure you discuss any questions you have with your health care provider.

## 2014-11-12 ENCOUNTER — Encounter: Payer: Self-pay | Admitting: Nurse Practitioner

## 2014-11-12 ENCOUNTER — Ambulatory Visit (INDEPENDENT_AMBULATORY_CARE_PROVIDER_SITE_OTHER)
Admission: RE | Admit: 2014-11-12 | Discharge: 2014-11-12 | Disposition: A | Discharge status: Home / Self Care | Payer: Medicare Other | Source: Ambulatory Visit | Attending: Nurse Practitioner | Admitting: Nurse Practitioner

## 2014-11-12 ENCOUNTER — Other Ambulatory Visit: Payer: Self-pay | Admitting: *Deleted

## 2014-11-12 ENCOUNTER — Ambulatory Visit (INDEPENDENT_AMBULATORY_CARE_PROVIDER_SITE_OTHER): Payer: Medicare Other | Admitting: Nurse Practitioner

## 2014-11-12 VITALS — BP 160/70 | HR 57 | Ht 67.0 in | Wt 163.8 lb

## 2014-11-12 DIAGNOSIS — I259 Chronic ischemic heart disease, unspecified: Secondary | ICD-10-CM

## 2014-11-12 DIAGNOSIS — R55 Syncope and collapse: Secondary | ICD-10-CM | POA: Diagnosis not present

## 2014-11-12 LAB — BASIC METABOLIC PANEL
BUN: 11 mg/dL (ref 6–23)
CO2: 30 mEq/L (ref 19–32)
Calcium: 9.6 mg/dL (ref 8.4–10.5)
Chloride: 97 mEq/L (ref 96–112)
Creatinine, Ser: 0.81 mg/dL (ref 0.40–1.50)
GFR: 97.58 mL/min (ref >60.00)
Glucose, Bld: 96 mg/dL (ref 70–99)
Potassium: 4.2 mEq/L (ref 3.5–5.1)
Sodium: 130 mEq/L — ABNORMAL LOW (ref 135–145)

## 2014-11-12 NOTE — Patient Instructions (Addendum)
We will be checking the following labs today BMET  We will arrange for a Myoview/lexiscan  We will arrange a CT of the head  We will be placing an event monitor today  You are advised to not drive at this time  See Dr. Percival Spanish in 6 weeks  Call the [place] office at [phone] if you have any questions, problems or concerns.

## 2014-11-12 NOTE — Progress Notes (Signed)
CARDIOLOGY OFFICE NOTE  Date:  11/12/2014    [place] Date of Birth: 05-11-1933 Medical Record #[id number]  PCP:  Olga Millers, MD  Cardiologist:  [facility]    Chief Complaint  Patient presents with  . Loss of Consciousness    Work in visit - seen for Dr. Percival Spanish     History of Present Illness: [place] is a 81 y.o. male who presents today for a work in visit. Seen for Dr. Percival Spanish. He has known CAD with remote CABG in 1994, then with inferior MI and PCI - details not readily available. Last Myoview in October of 2014 which was normal. His other issues are as noted below.   He was in the ER on 11/09/2014 after a spell of syncope. It was noted that he had "went to use the bathroom about 4 in the morning to urinate. He had no preceding symptoms and was found on the ground by his wife. He had fallen backwards into some clothes and was unresponsive for a couple seconds. When his wife found him he was diaphoretic. He denied any preceding chest pain, shortness of breath, abdominal pain. He denies any recent illnesses. He denies any vomiting, fevers, diarrhea, abdominal pain, leg swelling or pain. He has a history of CABG 20 years ago. He currently has no complaints. He is able to ambulate without difficulty. He had no head injury". Seen by the ER MD and felt to have micturition syncope, but given patient's extensive cardiac history recommended cardiac evaluation. Thus added to my schedule today.  Comes in today. Here with his wife. She gives a lot of the history. He does not remember much about the event. Does not think he had any warning. She thought he was heading back to the bedroom. No seizure. He was not incontinent. No chest pain. He has done fine since then. He has been back up at night to use the bathroom without occurrence. He was advised to not drive - but he is back driving. [place]. His work up in the ER was benign but with slightly low sodium. No  CT scan of the head.   Past Medical History  Diagnosis Date  . CORONARY ARTERY DISEASE     Lexiscan Myoview (10/14):  Low risk, inf thinning, no ischemia, EF 48%  . HEMORRHOIDS, HX OF   . HYPERLIPIDEMIA   . HYPERTENSION   . LABRYNTHITIS   . MYOCARDIAL INFARCTION, INFERIOR WALL, SUBSEQUENT CARE 06/11/2009  . PERCUTANEOUS TRANSLUMINAL CORONARY ANGIOPLASTY, HX OF 08/16/2007  . SYNCOPE, VASOVAGAL     Past Surgical History  Procedure Laterality Date  . Coronary artery bypass graft      L internal mammary artery to L anterior, saphenous vein graft [Other]  . Correction of abdominal birth defect      Age 88     Medications: Current Outpatient Prescriptions  Medication Sig Dispense Refill  . aspirin 325 MG tablet Take 325 mg by mouth daily.      . CRESTOR 20 MG tablet TAKE ONE TABLET BY MOUTH EVERY DAY 30 tablet 10  . nitroGLYCERIN (NITROSTAT) 0.4 MG SL tablet Place 1 tablet (0.4 mg total) under the tongue every 5 (five) minutes as needed for chest pain. 25 tablet 3  . Omega-3 Fatty Acids (FISH OIL) 1200 MG CAPS Take 1 capsule by mouth daily.      . trandolapril (MAVIK) 2 MG tablet TAKE ONE TABLET BY MOUTH EVERY DAY 30  tablet 10   No current facility-administered medications for this visit.    Allergies: No Known Allergies  Social History: The patient  reports that he quit smoking about 43 years ago. His smoking use included Cigarettes. He has never used smokeless tobacco. He reports that he does not drink alcohol or use illicit drugs.   Family History: The patient's family history includes Arrhythmia in his mother; Coronary artery disease in his father; Diverticulitis in his mother; Lung cancer in his brother; Osteoarthritis in his father; Parkinsonism in his father; Rheum arthritis in his father. There is no history of Colon cancer or Prostate cancer.   Review of Systems: Please see the history of present illness.   Otherwise, the review of systems is positive for passing out.    All other systems are reviewed and negative.   Physical Exam: VS:  BP 160/70 mmHg  Pulse 57  Ht 5\' 7"  (1.702 m)  Wt 163 lb 12.8 oz (74.299 kg)  BMI 25.65 kg/m2 .  BMI Body mass index is 25.65 kg/(m^2).  BP is 140/80 by me.  General: Pleasant. Well developed, well nourished and in no acute distress.  HEENT: Normal. Neck: Supple, no JVD, carotid bruits, or masses noted.  Cardiac: Regular rate and rhythm. No murmurs, rubs, or gallops. No edema.  Respiratory:  Lungs are clear to auscultation bilaterally with normal work of breathing.  GI: Soft and nontender.  MS: No deformity or atrophy. Gait and ROM intact. Skin: Warm and dry. Color is normal.  Neuro:  Strength and sensation are intact and no gross focal deficits noted.  Psych: Alert, appropriate and with normal affect.   Wt Readings from Last 3 Encounters:  11/12/14 163 lb 12.8 oz (74.299 kg)  11/09/14 154 lb (69.854 kg)  07/03/14 158 lb (71.668 kg)    LABORATORY DATA:  EKG:  EKG is ordered today. The EKG ordered today demonstrates sinus bradycardia with nonspecific ST/T wave changes.  Lab Results  Component Value Date   WBC 8.6 11/09/2014   HGB 13.6 11/09/2014   HCT 40.7 11/09/2014   PLT 215 11/09/2014   GLUCOSE 99 11/09/2014   CHOL 148 11/27/2013   TRIG 78.0 11/27/2013   HDL 61.80 11/27/2013   LDLCALC 71 11/27/2013   ALT 22 11/09/2014   AST 23 11/09/2014   NA 131* 11/09/2014   K 4.7 11/09/2014   CL 97 11/09/2014   CREATININE 0.90 11/09/2014   BUN 16 11/09/2014   CO2 27 11/09/2014   TSH 5.064* 10/13/2010   PSA 1.05 09/23/2008   INR 1.02 10/13/2010    BNP (last 3 results) No results for input(s): PROBNP in the last 8760 hours.  Other Studies Reviewed Today:   Myoview Impression from October 2015 Exercise Capacity: [place] with low level exercise. BP Response: Normal blood pressure response. Clinical Symptoms: There is dyspnea. ECG Impression: No significant ST segment change suggestive of  ischemia. Comparison with Prior Nuclear Study: No images to compare  Overall Impression: Low risk stress nuclear study Thinning of the inferior base not thought to be significant No ischemia .  LV Ejection Fraction: 48%. LV Wall Motion: Low normal EF. No discrete RWMA seen     Echo Study Conclusions from 2011  - Left ventricle: The cavity size was normal. Wall thickness was  increased in a pattern of mild LVH. Systolic function was normal.  The estimated ejection fraction was in the range of 50% to 55%.  Wall motion was normal; there were no regional wall motion  abnormalities. Doppler parameters are consistent with abnormal  left ventricular relaxation (grade 1 diastolic dysfunction). - Aortic valve: Mild regurgitation. - Pulmonic valve: Peak gradient: 50mm Hg (S).    Assessment/Plan: 1. Syncope - probably micturition syncope - without recurrence but has history of CAD with remote CABG. Will discuss with Dr. Mare Ferrari (DOD). Would favor CT of the head, Myoview and event monitor. After discussion with Dr. Mare Ferrari he is in agreement. I have reminded him to not drive at this time.   2. CAD - remote CABG - will update his Myoview  3. Hyponatremia - recheck BMET  4. HLD  Current medicines are reviewed with the patient today.  The patient does not have concerns regarding medicines.  The following changes have been made:    Labs/ tests ordered today include: See above.     Orders Placed This Encounter  Procedures  . CT Head Wo Contrast  . Basic metabolic panel  . Myocardial Perfusion Imaging  . EKG 12-Lead     Disposition:   FU with Dr. Percival Spanish in 6 weeks  Patient is agreeable to this plan and will call if any problems develop in the interim.   Signed: Burtis Junes, RN, ANP-C 11/12/2014 3:19 PM  [facility] [address] [place], [place]  [address] Phone: [phone] Fax: [phone]

## 2014-11-12 NOTE — Addendum Note (Signed)
Addended by: Burtis Junes on: 11/12/2014 03:40 PM   Modules accepted: Orders

## 2014-11-14 ENCOUNTER — Other Ambulatory Visit: Payer: Self-pay | Admitting: *Deleted

## 2014-11-14 MED ORDER — NITROGLYCERIN 0.4 MG SL SUBL: 0.4000 mg | SUBLINGUAL_TABLET | SUBLINGUAL | Status: DC | PRN

## 2014-11-15 ENCOUNTER — Encounter (HOSPITAL_COMMUNITY): Payer: Medicare Other

## 2014-11-19 ENCOUNTER — Encounter: Payer: Self-pay | Admitting: *Deleted

## 2014-11-19 ENCOUNTER — Encounter (INDEPENDENT_AMBULATORY_CARE_PROVIDER_SITE_OTHER): Payer: Medicare Other

## 2014-11-19 ENCOUNTER — Ambulatory Visit (HOSPITAL_COMMUNITY): Payer: Medicare Other | Attending: Cardiology | Admitting: Radiology

## 2014-11-19 DIAGNOSIS — R9431 Abnormal electrocardiogram [ECG] [EKG]: Secondary | ICD-10-CM | POA: Diagnosis not present

## 2014-11-19 DIAGNOSIS — R55 Syncope and collapse: Secondary | ICD-10-CM

## 2014-11-19 DIAGNOSIS — I2510 Atherosclerotic heart disease of native coronary artery without angina pectoris: Secondary | ICD-10-CM

## 2014-11-19 DIAGNOSIS — I259 Chronic ischemic heart disease, unspecified: Secondary | ICD-10-CM

## 2014-11-19 MED ORDER — TECHNETIUM TC 99M SESTAMIBI GENERIC - CARDIOLITE
33.0000 | Freq: Once | INTRAVENOUS | Status: AC | PRN
  Administered 2014-11-19: 33 via INTRAVENOUS

## 2014-11-19 MED ORDER — REGADENOSON 0.4 MG/5ML IV SOLN
0.4000 mg | Freq: Once | INTRAVENOUS | Status: AC
  Administered 2014-11-19: 0.4 mg via INTRAVENOUS

## 2014-11-19 MED ORDER — TECHNETIUM TC 99M SESTAMIBI GENERIC - CARDIOLITE
11.0000 | Freq: Once | INTRAVENOUS | Status: AC | PRN
  Administered 2014-11-19: 11 via INTRAVENOUS

## 2014-11-19 NOTE — Progress Notes (Signed)
  [facility] [address] [place], [place] [address] [phone]    Cardiology Nuclear Med Study  [place] Best is a 80 y.o. male     MRN : [id number]     DOB: 1933-01-08  Procedure Date: 11/19/2014  Nuclear Med Background Indication for Stress Test:  Evaluation for Ischemia and Abnormal EKG History:  CAD, MPI 2014 (normal) EF 48% Cardiac Risk Factors: Lipids  Symptoms:  Syncope   Nuclear Pre-Procedure Caffeine/Decaff Intake:  None NPO After: 7:00pm   Lungs:  clear O2 Sat: 96% on room air. IV 0.9% NS with Angio Cath:  22g  IV Site: R Hand  IV Started by:  Matilde Haymaker, RN  Chest Size (in):  38 Cup Size: n/a  Height: 5\' 7"  (1.702 m)  Weight:  159 lb (72.122 kg)  BMI:  Body mass index is 24.9 kg/(m^2). Tech Comments:  n/a    Nuclear Med Study 1 or 2 day study: 1 day  Stress Test Type:  Lexiscan  Reading MD: n/a  Order Authorizing Provider:  Benjamin Stain and Kathrene Alu  Resting Radionuclide: Technetium 61m Sestamibi  Resting Radionuclide Dose: 11.0 mCi   Stress Radionuclide:  Technetium 58m Sestamibi  Stress Radionuclide Dose: 33.0 mCi           Stress Protocol Rest HR: 52 Stress HR: 74  Rest BP: 137/63 Stress BP: 134/65  Exercise Time (min): n/a METS: n/a   Predicted Max HR: 141 bpm % Max HR: 52.48 bpm Rate Pressure Product: 11396   Dose of Adenosine (mg):  n/a Dose of Lexiscan: 0.4 mg  Dose of Atropine (mg): n/a Dose of Dobutamine: n/a mcg/kg/min (at max HR)  Stress Test Technologist: Glade Lloyd, BS-ES  Nuclear Technologist:  Earl Many, CNMT     Rest Procedure:  Myocardial perfusion imaging was performed at rest 45 minutes following the intravenous administration of Technetium 49m Sestamibi. Rest ECG: Sinus bradycardia, RAD, cannot R/O prior septal MI.  Stress Procedure:  The patient received IV Lexiscan 0.4 mg over 15-seconds.  Technetium 33m Sestamibi injected at 30-seconds.  Quantitative spect images  were obtained after a 45 minute delay.  During the infusion of Lexiscan the patient had a one time cough.  Stress ECG: No significant ST segment change suggestive of ischemia.  QPS Raw Data Images:  Acquisition technically good; normal left ventricular size. Stress Images:  There is decreased uptake in the inferior wall and apex. Rest Images:  There is decreased uptake in the inferior wall and apex. Subtraction (SDS):  No evidence of ischemia. Transient Ischemic Dilatation (Normal <1.22):  1.12 Lung/Heart Ratio (Normal <0.45):  0.25  Quantitative Gated Spect Images QGS EDV:  119 ml QGS ESV:  55 ml  Impression Exercise Capacity:  Lexiscan with no exercise. BP Response:  Normal blood pressure response. Clinical Symptoms:  No chest pain or dyspnea. ECG Impression:  No significant ST segment change suggestive of ischemia. Comparison with Prior Nuclear Study: Compared to 08/07/13, no significant change  Overall Impression:  Low risk stress nuclear study with small, severe intensity, fixed inferior and apical defects consistent with thinning; no ischemia.  LV Ejection Fraction: 53%.  LV Wall Motion:  NL LV Function; NL Wall Motion  [name]

## 2014-11-19 NOTE — Progress Notes (Signed)
Patient ID: [place] Best, male   DOB: 12-09-1932, 81 y.o.   MRN: [id number] [employer] verite 30 day cardiac event monitor applied to patient.

## 2014-12-02 ENCOUNTER — Ambulatory Visit: Payer: Medicare Other | Admitting: Internal Medicine

## 2014-12-13 ENCOUNTER — Ambulatory Visit (INDEPENDENT_AMBULATORY_CARE_PROVIDER_SITE_OTHER): Payer: Medicare Other | Admitting: Internal Medicine

## 2014-12-13 ENCOUNTER — Encounter: Payer: Self-pay | Admitting: Internal Medicine

## 2014-12-13 ENCOUNTER — Other Ambulatory Visit (INDEPENDENT_AMBULATORY_CARE_PROVIDER_SITE_OTHER): Payer: Medicare Other

## 2014-12-13 VITALS — BP 142/62 | HR 57 | Temp 97.6°F | Resp 16 | Ht 67.0 in | Wt 163.0 lb

## 2014-12-13 DIAGNOSIS — Z0000 Encounter for general adult medical examination without abnormal findings: Secondary | ICD-10-CM

## 2014-12-13 DIAGNOSIS — E785 Hyperlipidemia, unspecified: Secondary | ICD-10-CM | POA: Diagnosis not present

## 2014-12-13 DIAGNOSIS — R55 Syncope and collapse: Secondary | ICD-10-CM

## 2014-12-13 DIAGNOSIS — I10 Essential (primary) hypertension: Secondary | ICD-10-CM

## 2014-12-13 LAB — LIPID PANEL
CHOL/HDL RATIO: 3
Cholesterol: 135 mg/dL (ref 0–200)
HDL: 53.2 mg/dL (ref >39.00)
LDL Cholesterol: 64 mg/dL (ref 0–99)
NonHDL: 81.8
Triglycerides: 88 mg/dL (ref 0.0–149.0)
VLDL: 17.6 mg/dL (ref 0.0–40.0)

## 2014-12-13 LAB — BASIC METABOLIC PANEL
BUN: 13 mg/dL (ref 6–23)
CO2: 29 mEq/L (ref 19–32)
Calcium: 9.6 mg/dL (ref 8.4–10.5)
Chloride: 99 mEq/L (ref 96–112)
Creatinine, Ser: 0.94 mg/dL (ref 0.40–1.50)
GFR: 82.16 mL/min (ref >60.00)
Glucose, Bld: 91 mg/dL (ref 70–99)
Potassium: 4.5 mEq/L (ref 3.5–5.1)
Sodium: 134 mEq/L — ABNORMAL LOW (ref 135–145)

## 2014-12-13 NOTE — Assessment & Plan Note (Signed)
Did have an episode of likely micturition syncope. He is wearing heart monitor for another week, no repeat events. myoview low risk and CT head without problems from syncopal episode. Recheck BMP as low sodium level on last blood work.

## 2014-12-13 NOTE — Assessment & Plan Note (Signed)
Check lipid panel today, stable on crestor and omega-3 FA. No side effects from medication. Check LFTs.

## 2014-12-13 NOTE — Patient Instructions (Signed)
We will check your blood work today and call you back with the results.   If you are doing well and have no new problems you can wait a year before coming back. If you have any new problems or questions please feel free to call the office.   I will follow the notes to see what happens with the heart monitor.   Health Maintenance A healthy lifestyle and preventative care can promote health and wellness.  Maintain regular health, dental, and eye exams.  Eat a healthy diet. Foods like vegetables, fruits, whole grains, low-fat dairy products, and lean protein foods contain the nutrients you need and are low in calories. Decrease your intake of foods high in solid fats, added sugars, and salt. Get information about a proper diet from your health care provider, if necessary.  Regular physical exercise is one of the most important things you can do for your health. Most adults should get at least 150 minutes of moderate-intensity exercise (any activity that increases your heart rate and causes you to sweat) each week. In addition, most adults need muscle-strengthening exercises on 2 or more days a week.   Maintain a healthy weight. The body mass index (BMI) is a screening tool to identify possible weight problems. It provides an estimate of body fat based on height and weight. Your health care provider can find your BMI and can help you achieve or maintain a healthy weight. For males 20 years and older:  A BMI below 18.5 is considered underweight.  A BMI of 18.5 to 24.9 is normal.  A BMI of 25 to 29.9 is considered overweight.  A BMI of 30 and above is considered obese.  Maintain normal blood lipids and cholesterol by exercising and minimizing your intake of saturated fat. Eat a balanced diet with plenty of fruits and vegetables. Blood tests for lipids and cholesterol should begin at age 9 and be repeated every 5 years. If your lipid or cholesterol levels are high, you are over age 41, or you are  at high risk for heart disease, you may need your cholesterol levels checked more frequently.Ongoing high lipid and cholesterol levels should be treated with medicines if diet and exercise are not working.  If you smoke, find out from your health care provider how to quit. If you do not use tobacco, do not start.  Lung cancer screening is recommended for adults aged 66-80 years who are at high risk for developing lung cancer because of a history of smoking. A yearly low-dose CT scan of the lungs is recommended for people who have at least a 30-pack-year history of smoking and are current smokers or have quit within the past 15 years. A pack year of smoking is smoking an average of 1 pack of cigarettes a day for 1 year (for example, a 30-pack-year history of smoking could mean smoking 1 pack a day for 30 years or 2 packs a day for 15 years). Yearly screening should continue until the smoker has stopped smoking for at least 15 years. Yearly screening should be stopped for people who develop a health problem that would prevent them from having lung cancer treatment.  If you choose to drink alcohol, do not have more than 2 drinks per day. One drink is considered to be 12 oz (360 mL) of beer, 5 oz (150 mL) of wine, or 1.5 oz (45 mL) of liquor.  Avoid the use of street drugs. Do not share needles with anyone. Ask for  help if you need support or instructions about stopping the use of drugs.  High blood pressure causes heart disease and increases the risk of stroke. Blood pressure should be checked at least every 1-2 years. Ongoing high blood pressure should be treated with medicines if weight loss and exercise are not effective.  If you are 6-16 years old, ask your health care provider if you should take aspirin to prevent heart disease.  Diabetes screening involves taking a blood sample to check your fasting blood sugar level. This should be done once every 3 years after age 63 if you are at a normal  weight and without risk factors for diabetes. Testing should be considered at a younger age or be carried out more frequently if you are overweight and have at least 1 risk factor for diabetes.  Colorectal cancer can be detected and often prevented. Most routine colorectal cancer screening begins at the age of 47 and continues through age 89. However, your health care provider may recommend screening at an earlier age if you have risk factors for colon cancer. On a yearly basis, your health care provider may provide home test kits to check for hidden blood in the stool. A small camera at the end of a tube may be used to directly examine the colon (sigmoidoscopy or colonoscopy) to detect the earliest forms of colorectal cancer. Talk to your health care provider about this at age 49 when routine screening begins. A direct exam of the colon should be repeated every 5-10 years through age 17, unless early forms of precancerous polyps or small growths are found.  People who are at an increased risk for hepatitis B should be screened for this virus. You are considered at high risk for hepatitis B if:  You were born in a country where hepatitis B occurs often. Talk with your health care provider about which countries are considered high risk.  Your parents were born in a high-risk country and you have not received a shot to protect against hepatitis B (hepatitis B vaccine).  You have HIV or AIDS.  You use needles to inject street drugs.  You live with, or have sex with, someone who has hepatitis B.  You are a man who has sex with other men (MSM).  You get hemodialysis treatment.  You take certain medicines for conditions like cancer, organ transplantation, and autoimmune conditions.  Hepatitis C blood testing is recommended for all people born from [birth year] through 1963 and any individual with known risk factors for hepatitis C.  Healthy men should no longer receive prostate-specific antigen (PSA) blood  tests as part of routine cancer screening. Talk to your health care provider about prostate cancer screening.  Testicular cancer screening is not recommended for adolescents or adult males who have no symptoms. Screening includes self-exam, a health care provider exam, and other screening tests. Consult with your health care provider about any symptoms you have or any concerns you have about testicular cancer.  Practice safe sex. Use condoms and avoid high-risk sexual practices to reduce the spread of sexually transmitted infections (STIs).  You should be screened for STIs, including gonorrhea and chlamydia if:  You are sexually active and are younger than 24 years.  You are older than 24 years, and your health care provider tells you that you are at risk for this type of infection.  Your sexual activity has changed since you were last screened, and you are at an increased risk for chlamydia or gonorrhea.  Ask your health care provider if you are at risk.  If you are at risk of being infected with HIV, it is recommended that you take a prescription medicine daily to prevent HIV infection. This is called pre-exposure prophylaxis (PrEP). You are considered at risk if:  You are a man who has sex with other men (MSM).  You are a heterosexual man who is sexually active with multiple partners.  You take drugs by injection.  You are sexually active with a partner who has HIV.  Talk with your health care provider about whether you are at high risk of being infected with HIV. If you choose to begin PrEP, you should first be tested for HIV. You should then be tested every 3 months for as long as you are taking PrEP.  Use sunscreen. Apply sunscreen liberally and repeatedly throughout the day. You should seek shade when your shadow is shorter than you. Protect yourself by wearing long sleeves, pants, a wide-brimmed hat, and sunglasses year round whenever you are outdoors.  Tell your health care  provider of new moles or changes in moles, especially if there is a change in shape or color. Also, tell your health care provider if a mole is larger than the size of a pencil eraser.  A one-time screening for abdominal aortic aneurysm (AAA) and surgical repair of large AAAs by ultrasound is recommended for men aged 95-75 years who are current or former smokers.  Stay current with your vaccines (immunizations). Document Released: 04/08/2008 Document Revised: 10/16/2013 Document Reviewed: 03/08/2011 [facility] Patient Information 2015 [place], [place]. This information is not intended to replace advice given to you by your health care provider. Make sure you discuss any questions you have with your health care provider.

## 2014-12-13 NOTE — Assessment & Plan Note (Signed)
Likely will age out of colonoscopy before next colonoscopy in 2017 (although could consider since health is overall good). Immunizations up to date. He is active and exercises, non-smoker.

## 2014-12-13 NOTE — Progress Notes (Signed)
Pre visit review using our clinic review tool, if applicable. No additional management support is needed unless otherwise documented below in the visit note. 

## 2014-12-13 NOTE — Progress Notes (Signed)
   Subjective:    Patient ID: [place], male    DOB: 1933-03-03, 81 y.o.   MRN: [id number]  HPI The patient is a 81 YO man who comes here for medicare wellness. Over the last year he has had an episode of syncope. He was seen by cardiology and had stress test (which is negative) and CT head (also negative). He is currently wearing an event monitor although it sounded like micturition syncope.   Diet: heart healthy Physical activity: active Depression/mood screen: negative Hearing: mild hearing loss, denies limitations in conversation Visual acuity: grossly normal, performs annual eye exam  ADLs: capable Fall risk: none Home safety: good Cognitive evaluation: intact to orientation, naming, recall and repetition EOL planning: adv directives discussed  I have personally reviewed and have noted 1. The patient's medical and social history 2. Their use of alcohol, tobacco or illicit drugs 3. Their current medications and supplements 4. The patient's functional ability including ADL's, fall risks, home safety risks and hearing or visual impairment. 5. Diet and physical activities 6. Evidence for depression or mood disorders 7. Care team reviewed and updated  Review of Systems  Constitutional: Negative for fever, activity change, appetite change, fatigue and unexpected weight change.  HENT: Negative.   Eyes: Negative.   Respiratory: Negative for apnea, cough, chest tightness, shortness of breath and wheezing.   Cardiovascular: Negative for chest pain, palpitations and leg swelling.  Gastrointestinal: Negative for nausea, abdominal pain, diarrhea, constipation and abdominal distention.  Musculoskeletal: Negative.   Skin: Negative.   Neurological: Negative for dizziness, seizures, syncope, weakness, light-headedness and headaches.  Psychiatric/Behavioral: Negative.       Objective:   Physical Exam  Constitutional: He is oriented to person, place, and time. He appears  well-developed and well-nourished.  HENT:  Head: Normocephalic and atraumatic.  Eyes: EOM are normal.  Neck: Normal range of motion.  Cardiovascular: Normal rate and regular rhythm.   Carotids without bruits  Pulmonary/Chest: Effort normal and breath sounds normal. No respiratory distress. He has no wheezes.  Abdominal: Soft. Bowel sounds are normal. He exhibits no distension. There is no tenderness.  Musculoskeletal: He exhibits no edema.  Neurological: He is alert and oriented to person, place, and time. Coordination normal.  Skin: Skin is warm and dry.   Filed Vitals:   12/13/14 0908  BP: 142/62  Pulse: 57  Temp: 97.6 F (36.4 C)  TempSrc: Oral  Resp: 16  Height: 5\' 7"  (1.702 m)  Weight: 163 lb (73.936 kg)  SpO2: 97%      Assessment & Plan:

## 2014-12-13 NOTE — Assessment & Plan Note (Signed)
Doing well on ACE-I, still with room to titrate current therapy. Check BMP. Stable and controlled.

## 2014-12-20 ENCOUNTER — Telehealth: Payer: Self-pay | Admitting: Cardiology

## 2014-12-20 NOTE — Telephone Encounter (Signed)
Follow Up   Pt called mailed Holter monitor.. Request a call to discuss results.

## 2014-12-23 ENCOUNTER — Telehealth: Payer: Self-pay | Admitting: Nurse Practitioner

## 2014-12-23 NOTE — Telephone Encounter (Signed)
New message       1. Is this related to a heart monitor you are wearing?  (If the patient says no, please ask     if they are caling about ICD/pacemaker.) yes---pt turned in monitor last fri 2. What is your issue??   (If the patient is calling for results of the heart monitor this     message should be sent to nurse.) Can pt drive?----did we get enough readings?

## 2014-12-24 NOTE — Telephone Encounter (Signed)
Dr. Percival Spanish to review today and advise

## 2014-12-24 NOTE — Telephone Encounter (Signed)
Pt in wanting to know the results from his heart monitor because he wants to know if he can start back driving again. Please f/u with pt  Thanks

## 2014-12-24 NOTE — Telephone Encounter (Signed)
Pt. Called and informed about his monitor and as soon as Dr. Percival Spanish gives me the results I'll let him know

## 2014-12-25 ENCOUNTER — Telehealth: Payer: Self-pay | Admitting: Cardiology

## 2014-12-25 NOTE — Telephone Encounter (Signed)
Pt wants to know if he can drive? Still waiting for his monitor results. If not at home please call-[phone].

## 2014-12-27 NOTE — Telephone Encounter (Signed)
Pt still waiting to hear from his monitor results. He also wants to know if he can drive.If not at home, please [phone].

## 2014-12-31 NOTE — Telephone Encounter (Signed)
I spoke with patient.  He is very anxious about returning to driving.  I advised that the monitor results are not back yet and that we would call him as soon as they have been reviewed.  If need be, I will have DOD review them if I can get them in my hand.  I inquired about his driving and explained that normally the driving restriction is for 6 months from the time of the syncopal episode.  I also explained that Dr Percival Spanish may override that if he sees fit.  Mr Mierzwa got very upset and said that no one told him that the driving restrictions were for 6 months.  He was under the understanding that he couldn't drive because he was wearing the monitor.  Naturally, he thought that since the monitor was off he was now able to drive.  I tried to explain that the monitor is to try to figure out the cause of the syncopal episode and the driving restriction is for his safety.  I try to reassure Mr Stansbury that I will send his concerns to Dr Percival Spanish and try to locate the monitor results.  He verbalized understanding.

## 2014-12-31 NOTE — Telephone Encounter (Signed)
Pt still waiting for monitor results and wants to know if he can drive.Please let them know something today if possible.

## 2014-12-31 NOTE — Telephone Encounter (Signed)
I left a message for Spencer Best.  I have reviewed the monitor.  He can start driving again.  Please call.

## 2015-01-01 NOTE — Telephone Encounter (Signed)
Pt made aware of results no questions at this time. Apologized on delay with getting back to him results. Reminded pt of appt with Dr. Percival Spanish on 3/28 @ 10;45am.

## 2015-01-01 NOTE — Telephone Encounter (Signed)
Pt. Called and he stated he got your message

## 2015-01-01 NOTE — Telephone Encounter (Signed)
-----   Message from Minus Breeding, MD sent at 12/30/2014  6:43 PM EST ----- Regarding: RE: Monitor Results Please call and record as a phone message.  Monitor was fine.  No abnormalities.  He can drive.  Thanks.  ----- Message -----    From: Newt Minion, RN    Sent: 12/24/2014   8:48 AM      To: Minus Breeding, MD Subject: Monitor Results                                Pt completed wearing a 30 day monitor on 2/26,  per your instructions pt is not to drive.  Pt had OV with Truitt Merle and is now wanting to know if okay to drive.  Monitor results being faxed over to you today for review, as Cecille Rubin is out of the office the rest of the week. Please advise how you would like me to address the pt.   Thanks  Newt Minion

## 2015-01-03 ENCOUNTER — Other Ambulatory Visit: Payer: Self-pay

## 2015-01-03 MED ORDER — ROSUVASTATIN CALCIUM 20 MG PO TABS: 20.0000 mg | ORAL_TABLET | Freq: Every day | ORAL | Status: DC

## 2015-01-03 MED ORDER — TRANDOLAPRIL 2 MG PO TABS: 2.0000 mg | ORAL_TABLET | Freq: Every day | ORAL | Status: DC

## 2015-01-20 ENCOUNTER — Ambulatory Visit (INDEPENDENT_AMBULATORY_CARE_PROVIDER_SITE_OTHER): Payer: Medicare Other | Admitting: Cardiology

## 2015-01-20 VITALS — BP 144/70 | HR 60 | Ht 67.0 in | Wt 168.3 lb

## 2015-01-20 DIAGNOSIS — I2510 Atherosclerotic heart disease of native coronary artery without angina pectoris: Secondary | ICD-10-CM

## 2015-01-20 DIAGNOSIS — I259 Chronic ischemic heart disease, unspecified: Secondary | ICD-10-CM | POA: Diagnosis not present

## 2015-01-20 NOTE — Progress Notes (Signed)
   HPI The patient presents for follow up of CAD with bypass surgery in 1994.  In January he had a syncopal episode and was in the emergency room. No etiology was identified. He subsequently had a stress perfusion study which demonstrated a fixed inferior and apical defect with an EF of 53% which was felt to be low risk.  He did wear a monitor which demonstrated no significant arrhythmias. Of note he did have a low sodium.  Since he was last seen he has had no further symptoms.  The patient denies any new symptoms such as chest discomfort, neck or arm discomfort. There has been no new shortness of breath, PND or orthopnea. There have been no reported palpitations, presyncope or syncope.  No Known Allergies  Current Outpatient Prescriptions  Medication Sig Dispense Refill  . aspirin 325 MG tablet Take 325 mg by mouth daily.      . nitroGLYCERIN (NITROSTAT) 0.4 MG SL tablet Place 1 tablet (0.4 mg total) under the tongue every 5 (five) minutes as needed for chest pain. 25 tablet 3  . Omega-3 Fatty Acids (FISH OIL) 1200 MG CAPS Take 1 capsule by mouth daily.      . rosuvastatin (CRESTOR) 20 MG tablet Take 1 tablet (20 mg total) by mouth daily. 90 tablet 1  . trandolapril (MAVIK) 2 MG tablet Take 1 tablet (2 mg total) by mouth daily. 90 tablet 1   No current facility-administered medications for this visit.    Past Medical History  Diagnosis Date  . CORONARY ARTERY DISEASE     Lexiscan Myoview (10/14):  Low risk, inf thinning, no ischemia, EF 48%  . HEMORRHOIDS, HX OF   . HYPERLIPIDEMIA   . HYPERTENSION   . LABRYNTHITIS   . MYOCARDIAL INFARCTION, INFERIOR WALL, SUBSEQUENT CARE 06/11/2009  . PERCUTANEOUS TRANSLUMINAL CORONARY ANGIOPLASTY, HX OF 08/16/2007  . SYNCOPE, VASOVAGAL     Past Surgical History  Procedure Laterality Date  . Coronary artery bypass graft      L internal mammary artery to L anterior, saphenous vein graft [Other]  . Correction of abdominal birth defect      Age 81     ROS:  As stated in the HPI and negative for all other systems.  PHYSICAL EXAM BP 144/70 mmHg  Pulse 60  Ht 5\' 7"  (1.702 m)  Wt 168 lb 4.8 oz (76.34 kg)  BMI 26.35 kg/m2 GENERAL:  Well appearing NECK:  No jugular venous distention, waveform within normal limits, carotid upstroke brisk and symmetric, no bruits, no thyromegaly LUNGS:  Clear to auscultation bilaterally CHEST:  Well healed sternotomy scar. HEART:  PMI not displaced or sustained,S1 and S2 within normal limits, no S3, no S4, no clicks, no rubs, no murmurs ABD:  Flat, positive bowel sounds normal in frequency in pitch, no bruits, no rebound, no guarding, no midline pulsatile mass, no hepatomegaly, no splenomegaly EXT:  2 plus pulses throughout, no edema, no cyanosis no clubbing   ASSESSMENT AND PLAN  CAD:  The patient has no new sypmtoms.  No further cardiovascular testing is indicated.  We will continue with aggressive risk reduction and meds as listed.  SYNCOPE:    He will remain on the meds as listed.  This may have been orthostasis or vasovagal. No further workup is planned.

## 2015-01-20 NOTE — Patient Instructions (Signed)
Your physician wants you to follow-up in: 6 MONTHS WITH DR. HOCHREIN. You will receive a reminder letter in the mail two months in advance. If you don't receive a letter, please call our office to schedule the follow-up appointment.  

## 2015-01-21 ENCOUNTER — Encounter: Payer: Self-pay | Admitting: Cardiology

## 2015-07-17 ENCOUNTER — Ambulatory Visit (INDEPENDENT_AMBULATORY_CARE_PROVIDER_SITE_OTHER): Payer: Medicare Other | Admitting: Cardiology

## 2015-07-17 ENCOUNTER — Encounter: Payer: Self-pay | Admitting: Cardiology

## 2015-07-17 VITALS — BP 110/58 | HR 50 | Ht 66.0 in | Wt 164.0 lb

## 2015-07-17 DIAGNOSIS — I259 Chronic ischemic heart disease, unspecified: Secondary | ICD-10-CM

## 2015-07-17 DIAGNOSIS — I2510 Atherosclerotic heart disease of native coronary artery without angina pectoris: Secondary | ICD-10-CM | POA: Diagnosis not present

## 2015-07-17 DIAGNOSIS — I10 Essential (primary) hypertension: Secondary | ICD-10-CM

## 2015-07-17 NOTE — Progress Notes (Addendum)
   HPI The patient presents for follow up of CAD with bypass surgery in 1994.  In January he had a syncopal episode and was in the emergency room. No etiology was identified. He subsequently had a stress perfusion study which demonstrated a fixed inferior and apical defect with an EF of 53% which was felt to be low risk.  He did wear a monitor which demonstrated no significant arrhythmias. Since I last saw him he has done well.  The patient denies any new symptoms such as chest discomfort, neck or arm discomfort. There has been no new shortness of breath, PND or orthopnea. There have been no reported palpitations, presyncope or syncope.  No Known Allergies  Current Outpatient Prescriptions  Medication Sig Dispense Refill  . aspirin 325 MG tablet Take 325 mg by mouth daily.      . nitroGLYCERIN (NITROSTAT) 0.4 MG SL tablet Place 1 tablet (0.4 mg total) under the tongue every 5 (five) minutes as needed for chest pain. 25 tablet 3  . Omega-3 Fatty Acids (FISH OIL) 1200 MG CAPS Take 1 capsule by mouth daily.      . rosuvastatin (CRESTOR) 20 MG tablet Take 1 tablet (20 mg total) by mouth daily. 90 tablet 1  . trandolapril (MAVIK) 2 MG tablet Take 1 tablet (2 mg total) by mouth daily. 90 tablet 1   No current facility-administered medications for this visit.    Past Medical History  Diagnosis Date  . CORONARY ARTERY DISEASE     Lexiscan Myoview (10/14):  Low risk, inf thinning, no ischemia, EF 48%  . HEMORRHOIDS, HX OF   . HYPERLIPIDEMIA   . HYPERTENSION   . LABRYNTHITIS   . MYOCARDIAL INFARCTION, INFERIOR WALL, SUBSEQUENT CARE 06/11/2009  . PERCUTANEOUS TRANSLUMINAL CORONARY ANGIOPLASTY, HX OF 08/16/2007  . SYNCOPE, VASOVAGAL     Past Surgical History  Procedure Laterality Date  . Coronary artery bypass graft      L internal mammary artery to L anterior, saphenous vein graft [Other]  . Correction of abdominal birth defect      Age 81    ROS:  As stated in the HPI and negative for all  other systems.  PHYSICAL EXAM BP 110/58 mmHg  Pulse 50  Ht 5\' 6"  (1.676 m)  Wt 164 lb (74.39 kg)  BMI 26.48 kg/m2 GENERAL:  Well appearing NECK:  No jugular venous distention, waveform within normal limits, carotid upstroke brisk and symmetric, no bruits, no thyromegaly LUNGS:  Clear to auscultation bilaterally CHEST:  Well healed sternotomy scar. HEART:  PMI not displaced or sustained,S1 and S2 within normal limits, no S3, no S4, no clicks, no rubs, no murmurs ABD:  Flat, positive bowel sounds normal in frequency in pitch, no bruits, no rebound, no guarding, no midline pulsatile mass, no hepatomegaly, no splenomegaly EXT:  2 plus pulses throughout, no edema, no cyanosis no clubbing SKIN:  Rash on right shoulder and back.  EKG:   Sinus bradycardia, rate 50, axis within normal limits, intervals within normal limits, no acute ST-T wave changes.  ASSESSMENT AND PLAN  CAD:  He can reduce the ASA to 81 mg.  The patient has no new sypmtoms.  No further cardiovascular testing is indicated.  We will continue with aggressive risk reduction and meds as listed.  SYNCOPE:    He will remain on the meds as listed.  This may have been orthostasis or vasovagal. No further workup is planned.

## 2015-07-17 NOTE — Patient Instructions (Signed)
Your physician wants you to follow-up in: 1 Year. You will receive a reminder letter in the mail two months in advance. If you don't receive a letter, please call our office to schedule the follow-up appointment.  Your physician has recommended you make the following change in your medication: Decrease Aspirin to 81 mg daily

## 2015-09-03 DIAGNOSIS — Z23 Encounter for immunization: Secondary | ICD-10-CM | POA: Diagnosis not present

## 2015-09-23 ENCOUNTER — Telehealth: Payer: Self-pay | Admitting: Cardiology

## 2015-09-23 NOTE — Telephone Encounter (Addendum)
Pt had near-fall last night while getting up to use the bathroom. He felt lightheaded when standing. Pt was able to catch and right himself w/o injury.  He denies problems today, w/e of feeling "a little weak". Pt could not recall BP checked last night at bedtime and has not checked BP this morning.  Notes history of BP running low. To address symptoms encouraged pt to drink fluids if BP confirmed low, teaching on BP parameters reinforced.  Advised if worse problems, syncope or presyncope to go to ER.   Appt made to see Tarri Fuller at 1:30pm on 11/30. This appt confirmed on phone w/ wife.

## 2015-09-23 NOTE — Telephone Encounter (Signed)
Spencer Best is calling because Spencer Best fell last night , from Dizziness, Please call    Thanks

## 2015-09-24 ENCOUNTER — Encounter: Payer: Self-pay | Admitting: Physician Assistant

## 2015-09-24 ENCOUNTER — Ambulatory Visit (INDEPENDENT_AMBULATORY_CARE_PROVIDER_SITE_OTHER): Payer: Medicare Other | Admitting: Physician Assistant

## 2015-09-24 ENCOUNTER — Ambulatory Visit (INDEPENDENT_AMBULATORY_CARE_PROVIDER_SITE_OTHER): Payer: Medicare Other

## 2015-09-24 VITALS — BP 122/70 | HR 55 | Ht 67.0 in | Wt 163.8 lb

## 2015-09-24 DIAGNOSIS — R55 Syncope and collapse: Secondary | ICD-10-CM

## 2015-09-24 DIAGNOSIS — I259 Chronic ischemic heart disease, unspecified: Secondary | ICD-10-CM

## 2015-09-24 DIAGNOSIS — I10 Essential (primary) hypertension: Secondary | ICD-10-CM

## 2015-09-24 DIAGNOSIS — I2510 Atherosclerotic heart disease of native coronary artery without angina pectoris: Secondary | ICD-10-CM | POA: Diagnosis not present

## 2015-09-24 DIAGNOSIS — R279 Unspecified lack of coordination: Secondary | ICD-10-CM

## 2015-09-24 DIAGNOSIS — E785 Hyperlipidemia, unspecified: Secondary | ICD-10-CM | POA: Diagnosis not present

## 2015-09-24 NOTE — Patient Instructions (Signed)
Your physician has recommended that you wear an event monitor. Event monitors are medical devices that record the heart's electrical activity. Doctors most often [place] these monitors to diagnose arrhythmias. Arrhythmias are problems with the speed or rhythm of the heartbeat. The monitor is a small, portable device. You can wear one while you do your normal daily activities. This is usually used to diagnose what is causing palpitations/syncope (passing out).  Your Doctor has ordered you to wear a heart monitor. You will wear this for 7 days.   TIPS -  REMINDERS 1. The sensor is the lanyard that is worn around your neck every day - this is powered by a battery that needs to be changed every day 2. The monitor is the device that allows you to record symptoms - this will need to be charged daily 3. The sensor & monitor need to be within 100 feet of each other at all times 4. The sensor connects to the electrodes (stickers) - these should be changed every 24-48 hours (you do not have to remove them when you bathe, just make sure they are dry when you connect it back to the sensor 5. If you need more supplies (electrodes, batteries), please call the 1-800 # on the back of the pamphlet and [employer] will mail you more supplies 6. If your skin becomes sensitive, please try the sample pack of sensitive skin electrodes (the white packet in your silver box) and call [employer] to have them mail you more of these type of electrodes 7. When you are finish wearing the monitor, please place all supplies back in the silver box, place the silver box in the pre-packaged UPS bag and drop off at [employer] or call them so they can come pick it up   Cardiac Event Monitoring A cardiac event monitor is a small recording device used to help detect abnormal heart rhythms (arrhythmias). The monitor is used to record heart rhythm when noticeable symptoms such as the following occur:  Fast heartbeats (palpitations), such as heart racing  or fluttering.  Dizziness.  Fainting or light-headedness.  Unexplained weakness. The monitor is wired to two electrodes placed on your chest. Electrodes are flat, sticky disks that attach to your skin. The monitor can be worn for up to 30 days. You will wear the monitor at all times, except when bathing.  HOW TO USE YOUR CARDIAC EVENT MONITOR A technician will prepare your chest for the electrode placement. The technician will show you how to place the electrodes, how to work the monitor, and how to replace the batteries. Take time to practice using the monitor before you leave the office. Make sure you understand how to send the information from the monitor to your health care provider. This requires a telephone with a landline, not a cell phone. You need to:  Wear your monitor at all times, except when you are in water:  Do not get the monitor wet.  Take the monitor off when bathing. Do not swim or use a hot tub with it on.  Keep your skin clean. Do not put body lotion or moisturizer on your chest.  Change the electrodes daily or any time they stop sticking to your skin. You might need to use tape to keep them on.  It is possible that your skin under the electrodes could become irritated. To keep this from happening, try to put the electrodes in slightly different places on your chest. However, they must remain in the area under  your left breast and in the upper right section of your chest.  Make sure the monitor is safely clipped to your clothing or in a location close to your body that your health care provider recommends.  Press the button to record when you feel symptoms of heart trouble, such as dizziness, weakness, light-headedness, palpitations, thumping, shortness of breath, unexplained weakness, or a fluttering or racing heart. The monitor is always on and records what happened slightly before you pressed the button, so do not worry about being too late to get good  information.  Keep a diary of your activities, such as walking, doing chores, and taking medicine. It is especially important to note what you were doing when you pushed the button to record your symptoms. This will help your health care provider determine what might be contributing to your symptoms. The information stored in your monitor will be reviewed by your health care provider alongside your diary entries.  Send the recorded information as recommended by your health care provider. It is important to understand that it will take some time for your health care provider to process the results.  Change the batteries as recommended by your health care provider. SEEK IMMEDIATE MEDICAL CARE IF:   You have chest pain.  You have extreme difficulty breathing or shortness of breath.  You develop a very fast heartbeat that persists.  You develop dizziness that does not go away.  You faint or constantly feel you are about to faint. Document Released: 07/20/2008 Document Revised: 02/25/2014 Document Reviewed: 04/09/2013 [facility] Patient Information 2015 [place], [place]. This information is not intended to replace advice given to you by your health care provider. Make sure you discuss any questions you have with your health care provider.

## 2015-09-24 NOTE — Progress Notes (Signed)
Patient ID: Spencer Best, male   DOB: 01/04/1933, 81 y.o.   MRN: [id number]    Date:  09/24/2015   ID:  Spencer Best, DOB 09/17/1933, MRN [id number]  PCP:  Hoyt Koch, MD  Primary Cardiologist:  [facility]   Chief Complaint  Patient presents with  . Follow-up    low BP//pt states he staggers, SOB on exertion     History of Present Illness: Spencer Best is a 81 y.o. male with a history of CAD with bypass surgery in 1994. In January 2016 he had a syncopal episode and was in the emergency room. No etiology was identified. He subsequently had a stress perfusion study which demonstrated a fixed inferior and apical defect with an EF of 53% which was felt to be low risk. He did wear a monitor which demonstrated no significant arrhythmias.   Patient presents today with complaints of poor balance and being discoordinated. The episodes are intermittent and started about 4-6 weeks ago. The room does not spin and he doesn't feel any symptoms prior or during staggering. 2 days ago after getting up at night and going to the bathroom he had a syncopal episode. His wife found him on the floor. He also fell a few days ago after taking the dog out and coming back up a couple stairs. particular episode he did not pass out but he was feeling uncoordinated and staggering. About 2-3 years ago he was working with physical therapy at [facility] neurology balance issues. 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 Up until about 3 months [facility]. He reports today he feels pretty good and has no particular complaints at the moment.    The patient currently denies nausea, vomiting, fever, chest pain, shortness of breath, orthopnea, PND, cough, congestion, abdominal pain, hematochezia, melena, lower extremity edema, claudication.  Wt Readings from Last 3 Encounters:  09/24/15 163 lb 12.8 oz (74.299 kg)  07/17/15 164 lb (74.39 kg)  01/20/15 168 lb 4.8 oz (76.34 kg)      Past Medical History  Diagnosis Date  . CORONARY ARTERY DISEASE     Lexiscan Myoview (10/14):  Low risk, inf thinning, no ischemia, EF 48%  . HEMORRHOIDS, HX OF   . HYPERLIPIDEMIA   . HYPERTENSION   . LABRYNTHITIS   . MYOCARDIAL INFARCTION, INFERIOR WALL, SUBSEQUENT CARE 06/11/2009  . PERCUTANEOUS TRANSLUMINAL CORONARY ANGIOPLASTY, HX OF 08/16/2007  . SYNCOPE, VASOVAGAL     Current Outpatient Prescriptions  Medication Sig Dispense Refill  . aspirin 81 MG tablet Take 81 mg by mouth daily.    . nitroGLYCERIN (NITROSTAT) 0.4 MG SL tablet Place 1 tablet (0.4 mg total) under the tongue every 5 (five) minutes as needed for chest pain. 25 tablet 3  . Omega-3 Fatty Acids (FISH OIL) 1200 MG CAPS Take 1 capsule by mouth daily.      . rosuvastatin (CRESTOR) 20 MG tablet Take 1 tablet (20 mg total) by mouth daily. 90 tablet 1  . trandolapril (MAVIK) 2 MG tablet Take 1 tablet (2 mg total) by mouth daily. 90 tablet 1   No current facility-administered medications for this visit.    Allergies:   No Known Allergies  Social History:  The patient  reports that he quit smoking about 44 years ago. His smoking use included Cigarettes. He has never used smokeless tobacco. He reports that he does not drink alcohol or use illicit drugs.  Family history:   Family History  Problem Relation Age of Onset  . Coronary artery disease Father   . Osteoarthritis Father   . Parkinsonism Father   . Diverticulitis Mother   . Lung cancer Brother   . Colon cancer Neg Hx   . Prostate cancer Neg Hx   . Arrhythmia Mother   . Rheum arthritis Father     ROS:  Please see the history of present illness.  All other systems reviewed and negative.   PHYSICAL EXAM: VS:  BP 122/70 mmHg  Pulse 55  Ht 5\' 7"  (1.702 m)  Wt 163 lb 12.8 oz (74.299 kg)  BMI 25.65 kg/m2 Well nourished, well developed, in no acute distress HEENT: Pupils are equal round react to light accommodation extraocular movements are intact.   Neck: no JVDNo cervical lymphadenopathy. Cardiac: Regular rate and rhythm without murmurs rubs or gallops. Lungs:  clear to auscultation bilaterally, no wheezing, rhonchi or rales Abd: soft, nontender, positive bowel sounds all quadrants, no hepatosplenomegaly Ext: no lower extremity edema.  2+ radial and dorsalis pedis pulses. Skin: warm and dry. He has a scar on her right upper abdomen Neuro:  Grossly normal.  Strength is 5 over 5 in the upper and lower extremities. Cranial nerves II through XII are intact though his hearing is decreased. Finger to nose test normal heel to shin test normal.  EKG:  Sinus bradycardia rate 55 bpm  ASSESSMENT AND PLAN:  Problem List Items Addressed This Visit    SYNCOPE, VASOVAGAL   Relevant Medications   aspirin 81 MG tablet   Hyperlipidemia   Relevant Medications   aspirin 81 MG tablet   Essential hypertension   Relevant Medications   aspirin 81 MG tablet   Discoordination   Coronary atherosclerosis   Relevant Medications   aspirin 81 MG tablet    Other Visit Diagnoses    Syncope, unspecified syncope type    -  Primary    Relevant Medications    aspirin 81 MG tablet    Other Relevant Orders    EKG 12-Lead    Cardiac event monitor       Patient's syncopal episode sounds as though it was vasovagal and micturation induced.  He is having several episodes a week discoordination. He had a syncopal episode and these other issues will do an event monitor for 1 week.  He may need neurology referral.  Blood pressures well-controlled. Continue statin. He is not complaining of any chest pain do not think an ischemic evaluation echo was required.

## 2015-10-03 ENCOUNTER — Telehealth: Payer: Self-pay | Admitting: Physician Assistant

## 2015-10-03 NOTE — Telephone Encounter (Signed)
I called Spencer Best to discuss the results of his heart monitor.  NSR and sinus brady in the 50's.  Nothing to explain syncopal episode which I think was vasovagal.  He did tell me over the phone that the room does feel like it spins at times.  Sounds like he has BPV.  I recommend he see his primary doctor.  Tarri Fuller PAC

## 2015-10-29 DIAGNOSIS — H52203 Unspecified astigmatism, bilateral: Secondary | ICD-10-CM | POA: Diagnosis not present

## 2015-10-29 DIAGNOSIS — H40002 Preglaucoma, unspecified, left eye: Secondary | ICD-10-CM | POA: Diagnosis not present

## 2015-10-29 DIAGNOSIS — L309 Dermatitis, unspecified: Secondary | ICD-10-CM | POA: Diagnosis not present

## 2015-10-29 DIAGNOSIS — H2513 Age-related nuclear cataract, bilateral: Secondary | ICD-10-CM | POA: Diagnosis not present

## 2015-10-29 DIAGNOSIS — Z1283 Encounter for screening for malignant neoplasm of skin: Secondary | ICD-10-CM | POA: Diagnosis not present

## 2015-10-29 DIAGNOSIS — L570 Actinic keratosis: Secondary | ICD-10-CM | POA: Diagnosis not present

## 2015-10-29 DIAGNOSIS — X32XXXD Exposure to sunlight, subsequent encounter: Secondary | ICD-10-CM | POA: Diagnosis not present

## 2015-10-29 DIAGNOSIS — H5203 Hypermetropia, bilateral: Secondary | ICD-10-CM | POA: Diagnosis not present

## 2015-11-28 ENCOUNTER — Other Ambulatory Visit: Payer: Self-pay | Admitting: Cardiology

## 2015-11-28 NOTE — Telephone Encounter (Signed)
Rx request sent to pharmacy.  

## 2015-12-02 ENCOUNTER — Encounter: Payer: Medicare Other | Admitting: Internal Medicine

## 2015-12-17 ENCOUNTER — Encounter: Payer: Self-pay | Admitting: Internal Medicine

## 2015-12-17 ENCOUNTER — Ambulatory Visit (INDEPENDENT_AMBULATORY_CARE_PROVIDER_SITE_OTHER): Payer: Medicare Other | Admitting: Internal Medicine

## 2015-12-17 ENCOUNTER — Other Ambulatory Visit (INDEPENDENT_AMBULATORY_CARE_PROVIDER_SITE_OTHER): Payer: Medicare Other

## 2015-12-17 VITALS — BP 134/62 | HR 54 | Temp 97.9°F | Resp 14 | Ht 67.0 in | Wt 165.8 lb

## 2015-12-17 DIAGNOSIS — I10 Essential (primary) hypertension: Secondary | ICD-10-CM

## 2015-12-17 DIAGNOSIS — R413 Other amnesia: Secondary | ICD-10-CM

## 2015-12-17 DIAGNOSIS — E785 Hyperlipidemia, unspecified: Secondary | ICD-10-CM | POA: Diagnosis not present

## 2015-12-17 DIAGNOSIS — Z0000 Encounter for general adult medical examination without abnormal findings: Secondary | ICD-10-CM

## 2015-12-17 DIAGNOSIS — R279 Unspecified lack of coordination: Secondary | ICD-10-CM

## 2015-12-17 LAB — LIPID PANEL
CHOLESTEROL: 123 mg/dL (ref 0–200)
HDL: 47.6 mg/dL (ref >39.00)
LDL CALC: 56 mg/dL (ref 0–99)
NonHDL: 75.08
TRIGLYCERIDES: 94 mg/dL (ref 0.0–149.0)
Total CHOL/HDL Ratio: 3
VLDL: 18.8 mg/dL (ref 0.0–40.0)

## 2015-12-17 LAB — CBC
HCT: 35.8 % — ABNORMAL LOW (ref 39.0–52.0)
Hemoglobin: 12.1 g/dL — ABNORMAL LOW (ref 13.0–17.0)
MCHC: 33.8 g/dL (ref 30.0–36.0)
MCV: 85.9 fl (ref 78.0–100.0)
Platelets: 216 K/uL (ref 150.0–400.0)
RBC: 4.16 Mil/uL — ABNORMAL LOW (ref 4.22–5.81)
RDW: 14.6 % (ref 11.5–15.5)
WBC: 6.2 K/uL (ref 4.0–10.5)

## 2015-12-17 LAB — COMPREHENSIVE METABOLIC PANEL
ALBUMIN: 4.2 g/dL (ref 3.5–5.2)
ALT: 14 U/L (ref 0–53)
AST: 18 U/L (ref 0–37)
Alkaline Phosphatase: 57 U/L (ref 39–117)
BILIRUBIN TOTAL: 0.5 mg/dL (ref 0.2–1.2)
BUN: 12 mg/dL (ref 6–23)
CALCIUM: 9.3 mg/dL (ref 8.4–10.5)
CHLORIDE: 99 meq/L (ref 96–112)
CO2: 28 mEq/L (ref 19–32)
CREATININE: 0.86 mg/dL (ref 0.40–1.50)
GFR: 90.81 mL/min (ref >60.00)
Glucose, Bld: 95 mg/dL (ref 70–99)
Potassium: 4.6 mEq/L (ref 3.5–5.1)
Sodium: 133 mEq/L — ABNORMAL LOW (ref 135–145)
Total Protein: 7 g/dL (ref 6.0–8.3)

## 2015-12-17 LAB — VITAMIN B12: Vitamin B-12: 311 pg/mL (ref 211–911)

## 2015-12-17 LAB — TSH: TSH: 4.77 u[IU]/mL — ABNORMAL HIGH (ref 0.35–4.50)

## 2015-12-17 MED ORDER — ROSUVASTATIN CALCIUM 20 MG PO TABS: 20.0000 mg | ORAL_TABLET | Freq: Every day | ORAL | Status: DC

## 2015-12-17 MED ORDER — TRANDOLAPRIL 2 MG PO TABS: 2.0000 mg | ORAL_TABLET | Freq: Every day | ORAL | Status: DC

## 2015-12-17 NOTE — Assessment & Plan Note (Signed)
Aged out of colonoscopy, up to date on immunizations. Checking screening labs, gets good exercise. Non-smoker. Counseled on sun safety and mole surveillance. Given 10 year screening recommendations.

## 2015-12-17 NOTE — Progress Notes (Signed)
   Subjective:    Patient ID: Spencer Best, male    DOB: 23-Dec-1932, 82 y.o.   MRN: [id number]  HPI Here for medicare wellness, no new complaints. Please see A/P for status and treatment of chronic medical problems.   Diet: heart healthy Physical activity: active Depression/mood screen: negative Hearing: mild to moderate loss, declines hearing exam  Visual acuity: grossly normal with lens, performs annual eye exam  ADLs: capable Fall risk: low Home safety: good Cognitive evaluation: intact to orientation, naming, recall and repetition, some mild loss EOL planning: adv directives discussed, in place  I have personally reviewed and have noted 1. The patient's medical and social history - reviewed today no changes 2. Their use of alcohol, tobacco or illicit drugs 3. Their current medications and supplements 4. The patient's functional ability including ADL's, fall risks, home safety risks and hearing or visual impairment. 5. Diet and physical activities 6. Evidence for depression or mood disorders 7. Care team reviewed and updated (available in snapshot)  Review of Systems  Constitutional: Negative for fever, activity change, appetite change, fatigue and unexpected weight change.  HENT: Negative.   Eyes: Negative.   Respiratory: Negative for apnea, cough, chest tightness, shortness of breath and wheezing.   Cardiovascular: Negative for chest pain, palpitations and leg swelling.  Gastrointestinal: Negative for nausea, abdominal pain, diarrhea, constipation and abdominal distention.  Musculoskeletal: Negative.   Skin: Negative.   Neurological: Negative for dizziness, seizures, syncope, weakness, light-headedness and headaches.  Psychiatric/Behavioral: Negative.       Objective:   Physical Exam  Constitutional: He is oriented to person, place, and time. He appears well-developed and well-nourished.  HENT:  Head: Normocephalic and atraumatic.  Eyes: EOM are normal.  Neck:  Normal range of motion.  Cardiovascular: Normal rate and regular rhythm.   Carotids without bruits  Pulmonary/Chest: Effort normal and breath sounds normal. No respiratory distress. He has no wheezes.  Abdominal: Soft. Bowel sounds are normal. He exhibits no distension. There is no tenderness.  Musculoskeletal: He exhibits no edema.  Neurological: He is alert and oriented to person, place, and time. Coordination normal.  Skin: Skin is warm and dry.   Filed Vitals:   12/17/15 1002  BP: 134/62  Pulse: 54  Temp: 97.9 F (36.6 C)  TempSrc: Oral  Resp: 14  Height: 5\' 7"  (1.702 m)  Weight: 165 lb 12.8 oz (75.206 kg)  SpO2: 91%      Assessment & Plan:

## 2015-12-17 NOTE — Patient Instructions (Signed)
We have sent in the refills and will check the labs. We will also check a couple of other things for the memory. It is safe to try the supplement.   Health Maintenance, Male A healthy lifestyle and preventative care can promote health and wellness.  Maintain regular health, dental, and eye exams.  Eat a healthy diet. Foods like vegetables, fruits, whole grains, low-fat dairy products, and lean protein foods contain the nutrients you need and are low in calories. Decrease your intake of foods high in solid fats, added sugars, and salt. Get information about a proper diet from your health care provider, if necessary.  Regular physical exercise is one of the most important things you can do for your health. Most adults should get at least 150 minutes of moderate-intensity exercise (any activity that increases your heart rate and causes you to sweat) each week. In addition, most adults need muscle-strengthening exercises on 2 or more days a week.   Maintain a healthy weight. The body mass index (BMI) is a screening tool to identify possible weight problems. It provides an estimate of body fat based on height and weight. Your health care provider can find your BMI and can help you achieve or maintain a healthy weight. For males 20 years and older:  A BMI below 18.5 is considered underweight.  A BMI of 18.5 to 24.9 is normal.  A BMI of 25 to 29.9 is considered overweight.  A BMI of 30 and above is considered obese.  Maintain normal blood lipids and cholesterol by exercising and minimizing your intake of saturated fat. Eat a balanced diet with plenty of fruits and vegetables. Blood tests for lipids and cholesterol should begin at age 43 and be repeated every 5 years. If your lipid or cholesterol levels are high, you are over age 32, or you are at high risk for heart disease, you may need your cholesterol levels checked more frequently.Ongoing high lipid and cholesterol levels should be treated with  medicines if diet and exercise are not working.  If you smoke, find out from your health care provider how to quit. If you do not use tobacco, do not start.  Lung cancer screening is recommended for adults aged 78-80 years who are at high risk for developing lung cancer because of a history of smoking. A yearly low-dose CT scan of the lungs is recommended for people who have at least a 30-pack-year history of smoking and are current smokers or have quit within the past 15 years. A pack year of smoking is smoking an average of 1 pack of cigarettes a day for 1 year (for example, a 30-pack-year history of smoking could mean smoking 1 pack a day for 30 years or 2 packs a day for 15 years). Yearly screening should continue until the smoker has stopped smoking for at least 15 years. Yearly screening should be stopped for people who develop a health problem that would prevent them from having lung cancer treatment.  If you choose to drink alcohol, do not have more than 2 drinks per day. One drink is considered to be 12 oz (360 mL) of beer, 5 oz (150 mL) of wine, or 1.5 oz (45 mL) of liquor.  Avoid the use of street drugs. Do not share needles with anyone. Ask for help if you need support or instructions about stopping the use of drugs.  High blood pressure causes heart disease and increases the risk of stroke. High blood pressure is more likely to  develop in:  People who have blood pressure in the end of the normal range (100-139/85-89 mm Hg).  People who are overweight or obese.  People who are African American.  If you are 31-28 years of age, have your blood pressure checked every 3-5 years. If you are 81 years of age or older, have your blood pressure checked every year. You should have your blood pressure measured twice--once when you are at a hospital or clinic, and once when you are not at a hospital or clinic. Record the average of the two measurements. To check your blood pressure when you are not  at a hospital or clinic, you can use:  An automated blood pressure machine at a pharmacy.  A home blood pressure monitor.  If you are 69-6 years old, ask your health care provider if you should take aspirin to prevent heart disease.  Diabetes screening involves taking a blood sample to check your fasting blood sugar level. This should be done once every 3 years after age 5 if you are at a normal weight and without risk factors for diabetes. Testing should be considered at a younger age or be carried out more frequently if you are overweight and have at least 1 risk factor for diabetes.  Colorectal cancer can be detected and often prevented. Most routine colorectal cancer screening begins at the age of 106 and continues through age 37. However, your health care provider may recommend screening at an earlier age if you have risk factors for colon cancer. On a yearly basis, your health care provider may provide home test kits to check for hidden blood in the stool. A small camera at the end of a tube may be used to directly examine the colon (sigmoidoscopy or colonoscopy) to detect the earliest forms of colorectal cancer. Talk to your health care provider about this at age 82 when routine screening begins. A direct exam of the colon should be repeated every 5-10 years through age 75, unless early forms of precancerous polyps or small growths are found.  People who are at an increased risk for hepatitis B should be screened for this virus. You are considered at high risk for hepatitis B if:  You were born in a country where hepatitis B occurs often. Talk with your health care provider about which countries are considered high risk.  Your parents were born in a high-risk country and you have not received a shot to protect against hepatitis B (hepatitis B vaccine).  You have HIV or AIDS.  You use needles to inject street drugs.  You live with, or have sex with, someone who has hepatitis B.  You  are a man who has sex with other men (MSM).  You get hemodialysis treatment.  You take certain medicines for conditions like cancer, organ transplantation, and autoimmune conditions.  Hepatitis C blood testing is recommended for all people born from [birth year] through 1963 and any individual with known risk factors for hepatitis C.  Healthy men should no longer receive prostate-specific antigen (PSA) blood tests as part of routine cancer screening. Talk to your health care provider about prostate cancer screening.  Testicular cancer screening is not recommended for adolescents or adult males who have no symptoms. Screening includes self-exam, a health care provider exam, and other screening tests. Consult with your health care provider about any symptoms you have or any concerns you have about testicular cancer.  Practice safe sex. Use condoms and avoid high-risk sexual practices to reduce  the spread of sexually transmitted infections (STIs).  You should be screened for STIs, including gonorrhea and chlamydia if:  You are sexually active and are younger than 24 years.  You are older than 24 years, and your health care provider tells you that you are at risk for this type of infection.  Your sexual activity has changed since you were last screened, and you are at an increased risk for chlamydia or gonorrhea. Ask your health care provider if you are at risk.  If you are at risk of being infected with HIV, it is recommended that you take a prescription medicine daily to prevent HIV infection. This is called pre-exposure prophylaxis (PrEP). You are considered at risk if:  You are a man who has sex with other men (MSM).  You are a heterosexual man who is sexually active with multiple partners.  You take drugs by injection.  You are sexually active with a partner who has HIV.  Talk with your health care provider about whether you are at high risk of being infected with HIV. If you choose to begin  PrEP, you should first be tested for HIV. You should then be tested every 3 months for as long as you are taking PrEP.  Use sunscreen. Apply sunscreen liberally and repeatedly throughout the day. You should seek shade when your shadow is shorter than you. Protect yourself by wearing long sleeves, pants, a wide-brimmed hat, and sunglasses year round whenever you are outdoors.  Tell your health care provider of new moles or changes in moles, especially if there is a change in shape or color. Also, tell your health care provider if a mole is larger than the size of a pencil eraser.  A one-time screening for abdominal aortic aneurysm (AAA) and surgical repair of large AAAs by ultrasound is recommended for men aged 33-75 years who are current or former smokers.  Stay current with your vaccines (immunizations).   This information is not intended to replace advice given to you by your health care provider. Make sure you discuss any questions you have with your health care provider.   Document Released: 04/08/2008 Document Revised: 11/01/2014 Document Reviewed: 03/08/2011 Elsevier Interactive Patient Education [employer].

## 2015-12-17 NOTE — Assessment & Plan Note (Signed)
Checking some extra labs today as well as for the mild memory impairment.

## 2015-12-17 NOTE — Assessment & Plan Note (Signed)
BP at goal on trandolapril, checking CMP and adjust as needed.

## 2015-12-17 NOTE — Assessment & Plan Note (Signed)
Checking lipid panel and adjust as needed. On crestor 20 mg daily without side effects.

## 2015-12-17 NOTE — Progress Notes (Signed)
Pre visit review using our clinic review tool, if applicable. No additional management support is needed unless otherwise documented below in the visit note. 

## 2016-01-26 DIAGNOSIS — H40002 Preglaucoma, unspecified, left eye: Secondary | ICD-10-CM | POA: Diagnosis not present

## 2016-01-26 DIAGNOSIS — H40012 Open angle with borderline findings, low risk, left eye: Secondary | ICD-10-CM | POA: Diagnosis not present

## 2016-02-01 IMAGING — CT CT HEAD W/O CM
1 series · 16 of 30 positions shown, 20 images · non-contrast
Comparison: 10/13/2010 CT and MR.

CLINICAL DATA: 81-year-old male with syncopal episode in bathroom 3
days ago. Did not strike head. History of high blood pressure and
hyperlipidemia. Initial encounter.

EXAM:
CT HEAD WITHOUT CONTRAST
TECHNIQUE: Contiguous axial images were obtained from the base of the skull
through the vertex without intravenous contrast.

[Series 2: head_seq -c 4.5 h37s st · axial · 0.43mm/px · z∈[-110,+16]mm · 16 of 32 slices shown, 20 images]
[im 2/32  brain]
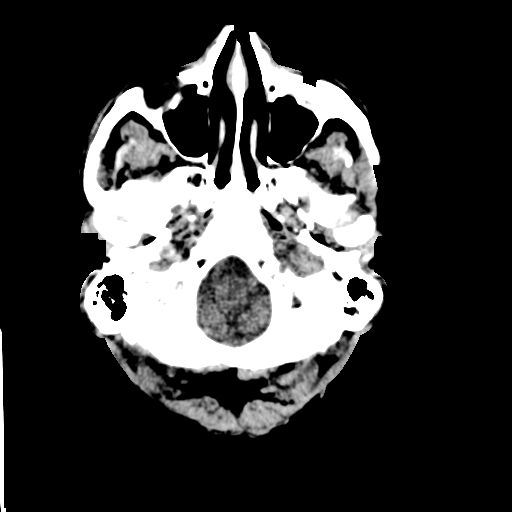
[im 2/32  bone]
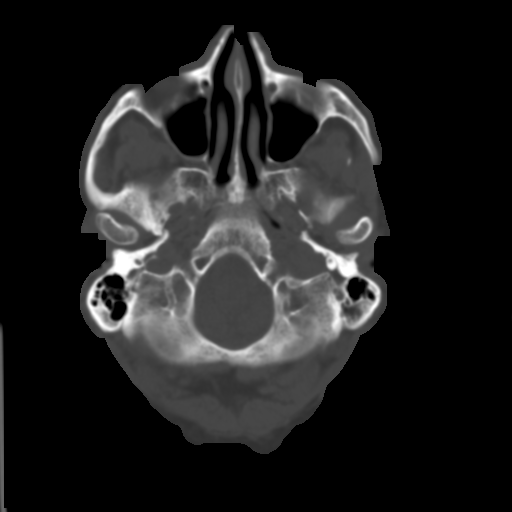
[im 4/32  brain]
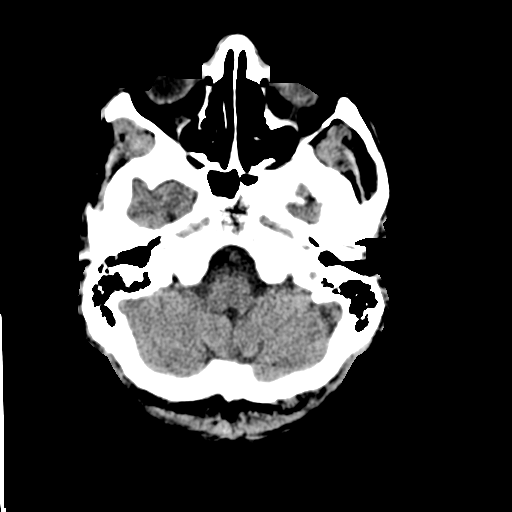
[im 6/32  brain]
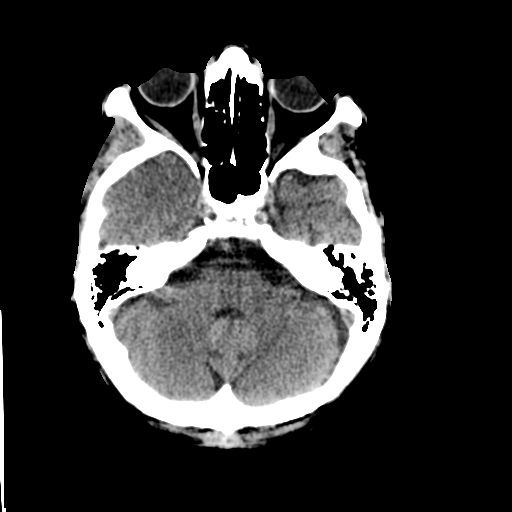
[im 8/32  brain]
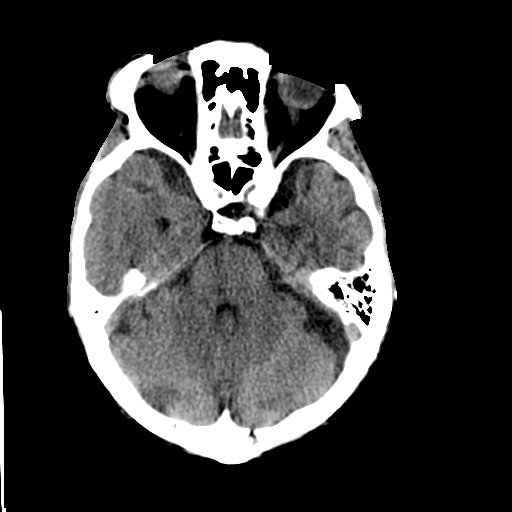
[im 9/32  brain]
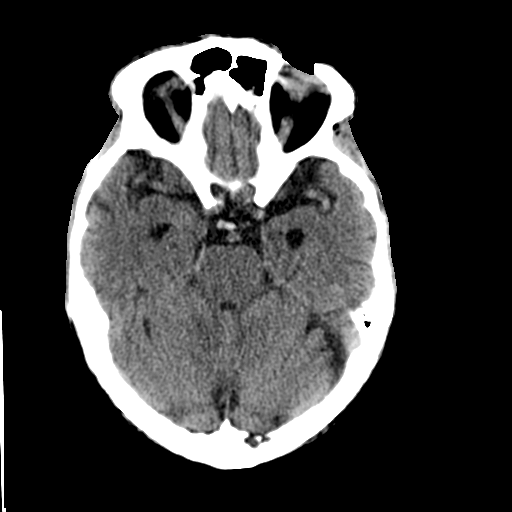
[im 9/32  bone]
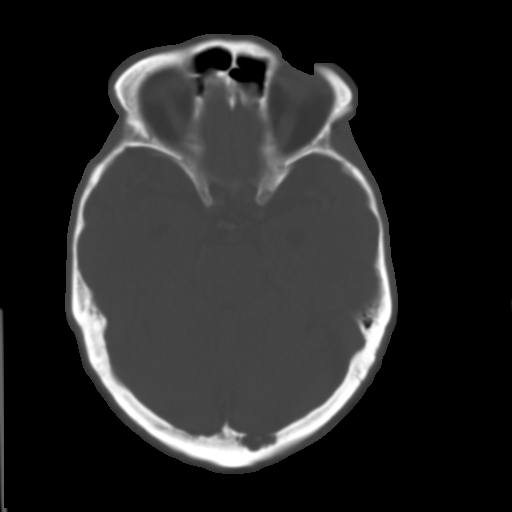
[im 11/32  brain]
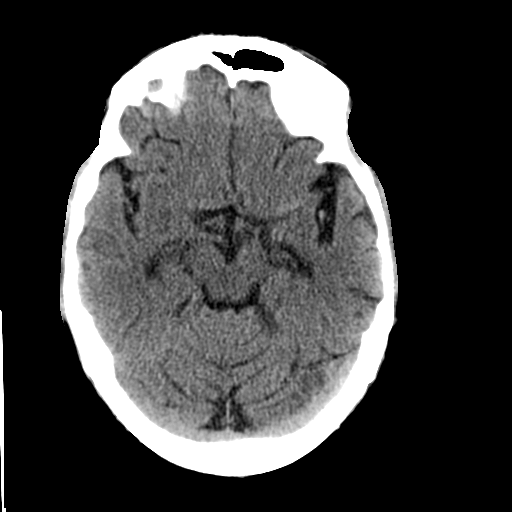
[im 13/32  brain]
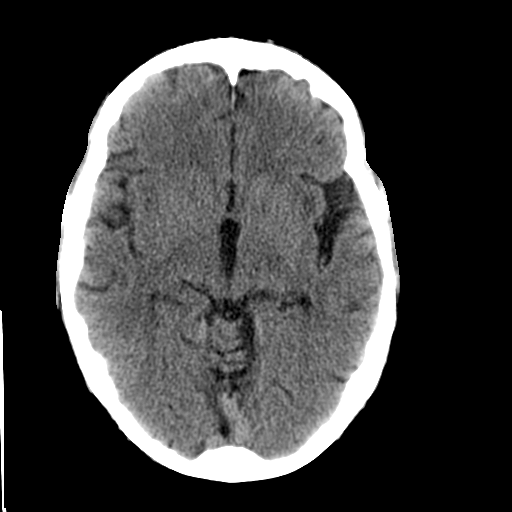
[im 15/32  brain]
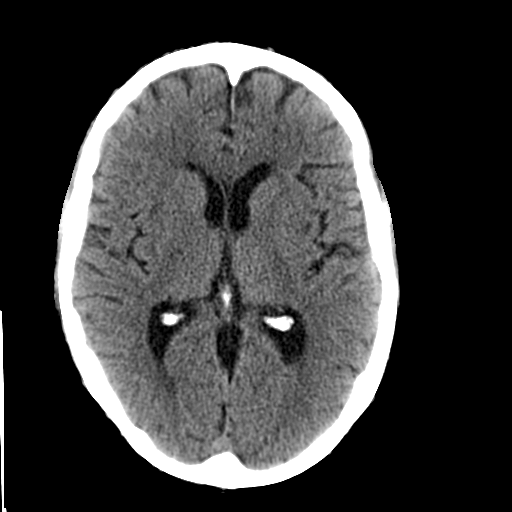
[im 17/32  brain]
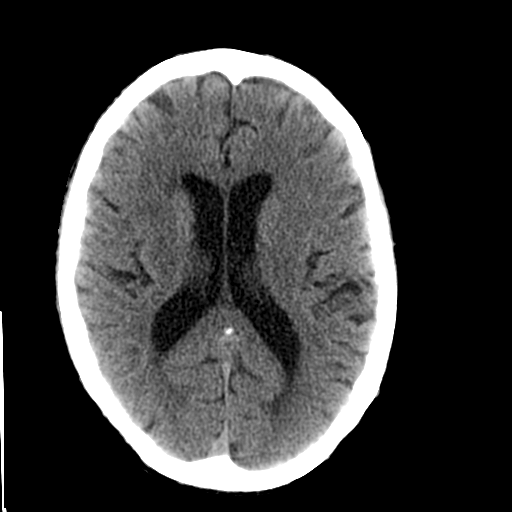
[im 17/32  bone]
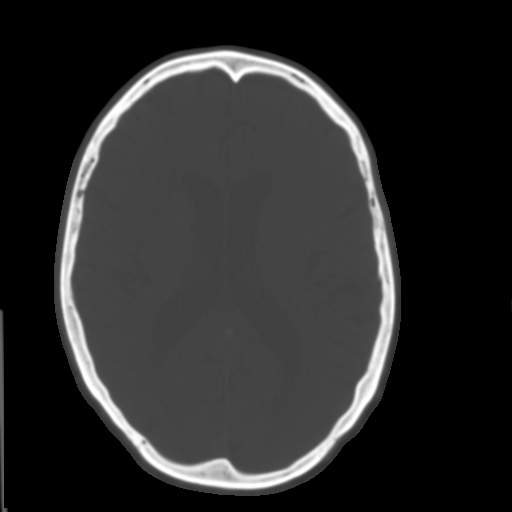
[im 19/32  brain]
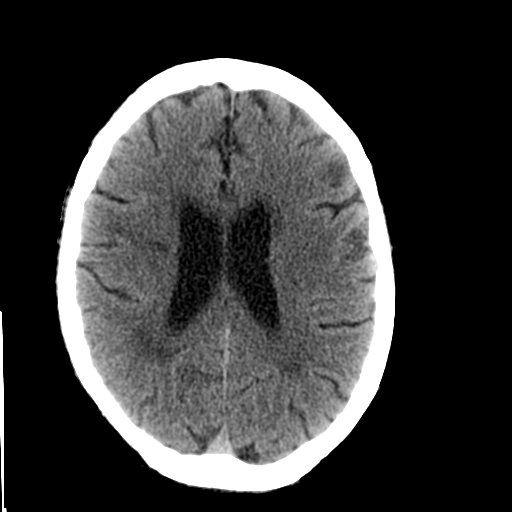
[im 21/32  brain]
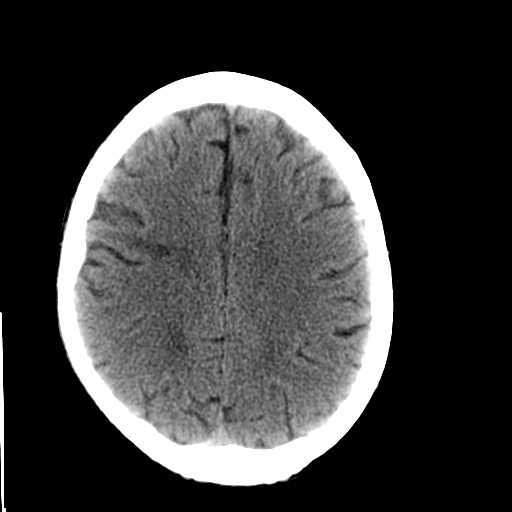
[im 23/32  brain]
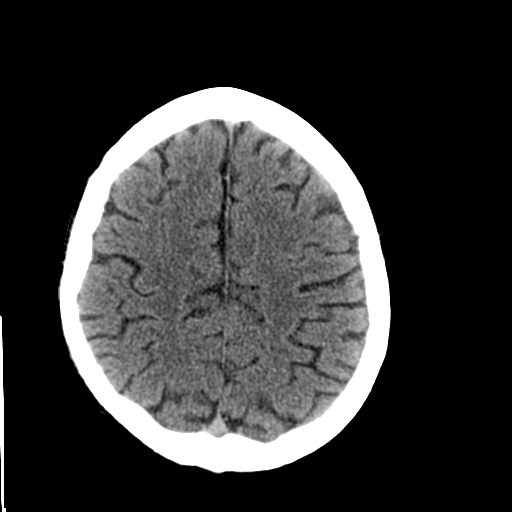
[im 24/32  brain]
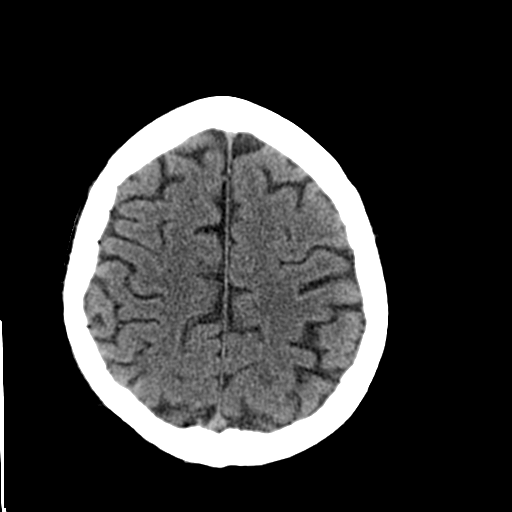
[im 24/32  bone]
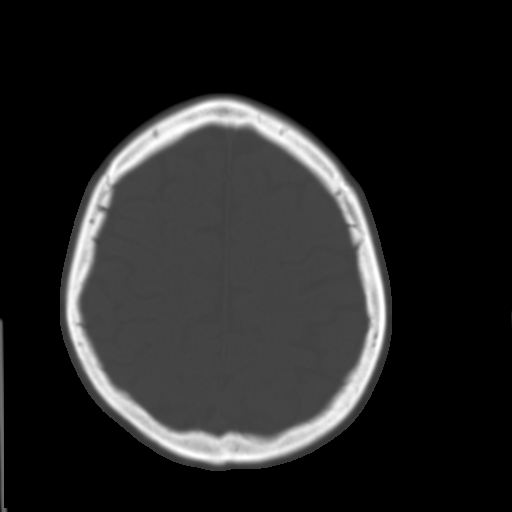
[im 26/32  brain]
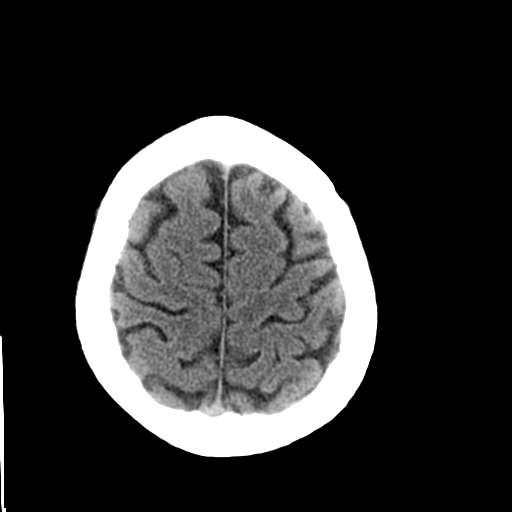
[im 28/32  brain]
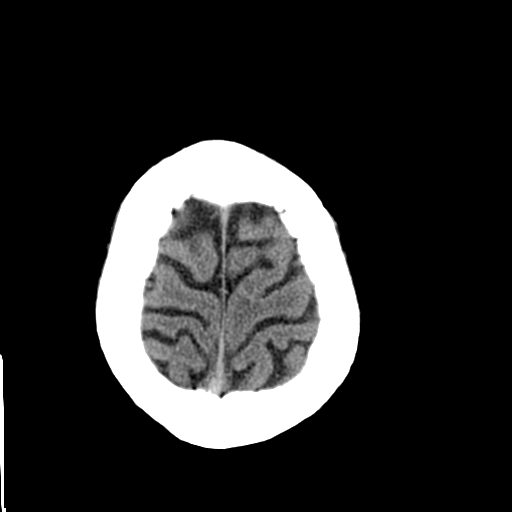
[im 30/32  brain]
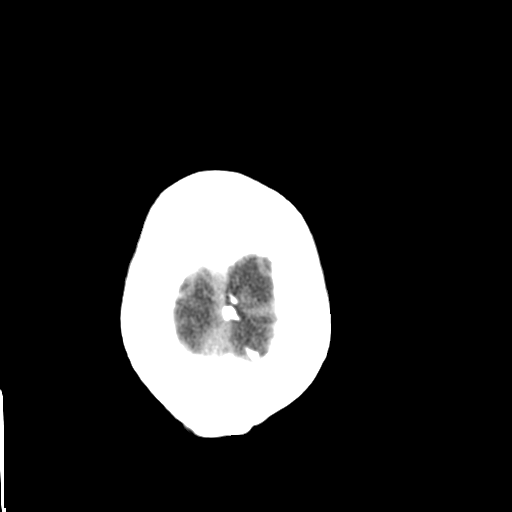

[16 of 30 positions shown; findings below may reference images not displayed]

FINDINGS: No skull fracture or intracranial hemorrhage.

Remote right frontal lobe infarct and small vessel disease type
changes without CT evidence of large acute infarct.

No hydrocephalus.

No intracranial mass lesion noted on this unenhanced exam.

Vascular calcifications.

Orbital structures unremarkable.

Mastoid air cells, middle ear cavities and visualized paranasal
sinuses are clear.
IMPRESSION: No skull fracture or intracranial hemorrhage.

Remote right frontal lobe infarct and small vessel disease type
changes without CT evidence of large acute infarct.

No hydrocephalus.

## 2016-02-09 DIAGNOSIS — H40012 Open angle with borderline findings, low risk, left eye: Secondary | ICD-10-CM | POA: Diagnosis not present

## 2016-03-13 DIAGNOSIS — S30861A Insect bite (nonvenomous) of abdominal wall, initial encounter: Secondary | ICD-10-CM | POA: Diagnosis not present

## 2016-03-24 ENCOUNTER — Telehealth: Payer: Self-pay

## 2016-03-24 DIAGNOSIS — R413 Other amnesia: Secondary | ICD-10-CM

## 2016-03-24 NOTE — Telephone Encounter (Signed)
Could you please the wife or patient a call about a referral for maybe a neurology doctor. Please advise or follow up. Thank you.

## 2016-03-24 NOTE — Telephone Encounter (Signed)
They returned her call Amy, Plea give them a call as soon as you can. Thank you.

## 2016-03-24 NOTE — Telephone Encounter (Signed)
Patient is having problems with memory and wants a referral to neurology.

## 2016-03-24 NOTE — Telephone Encounter (Signed)
Returned patient's call. No answer, left message for patient to call back.

## 2016-03-25 NOTE — Telephone Encounter (Signed)
Referral placed.

## 2016-04-01 ENCOUNTER — Ambulatory Visit (INDEPENDENT_AMBULATORY_CARE_PROVIDER_SITE_OTHER): Payer: Medicare Other | Admitting: Neurology

## 2016-04-01 ENCOUNTER — Other Ambulatory Visit: Payer: Medicare Other

## 2016-04-01 ENCOUNTER — Encounter: Payer: Self-pay | Admitting: Neurology

## 2016-04-01 VITALS — BP 140/70 | HR 54 | Ht 67.0 in | Wt 167.1 lb

## 2016-04-01 DIAGNOSIS — F0391 Unspecified dementia with behavioral disturbance: Secondary | ICD-10-CM

## 2016-04-01 DIAGNOSIS — F03918 Unspecified dementia, unspecified severity, with other behavioral disturbance: Secondary | ICD-10-CM

## 2016-04-01 DIAGNOSIS — R292 Abnormal reflex: Secondary | ICD-10-CM

## 2016-04-01 DIAGNOSIS — M6249 Contracture of muscle, multiple sites: Secondary | ICD-10-CM | POA: Diagnosis not present

## 2016-04-01 DIAGNOSIS — M62838 Other muscle spasm: Secondary | ICD-10-CM

## 2016-04-01 NOTE — Patient Instructions (Addendum)
1.  Check MMA 2.  Start vitamin B50mcg daily 3.  MRI brain without contrast 4.  Neuropsychological testing 5.  Keep up the great work staying active!  Return to clinic in 2-3 months

## 2016-04-01 NOTE — Progress Notes (Signed)
[facility] Clinic Note - Initial Visit   Date: 04/01/2016  JEVONTE SUNG MRN: [id number] DOB: Sep 10, 1933   Dear Dr. Sharlet Salina:  Thank you for your kind referral of KALEEF SEDLAK for consultation of memory changes. Although his history is well known to you, please allow [place] to reiterate it for the purpose of our medical record. The patient was accompanied to the clinic by wife who also provides collateral information.     History of Present Illness: REY KUCZEK is a 82 y.o. right-handed Caucasian male with CAD, hyperlipidemia, hypertension, and vasovagal syncope presenting for evaluation of memory problems.    Late 2016, wife noticed that he would frequently ask her about doctor appointments, misplaces objects, and frequently forgets the date. He has problems with short-term memory moreso than long-term.  He continues to drive and has not been involved in any MVAs.  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  He usually always travels with his wife and she had does not endorse any safety issues with driving.  He manages household finances, but his wife does oversee it.  He does is own ADLs and walks independently.    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  He reports forgetting to chores on his farms.  He continues to be very active on the farm - moves the cattle every 2-3 days, mowing, places dividers in the farm, goes out to eat.    Dementia questions Memory Are you repeating things excessively? No Are you forgetting important details of conversations/events? No Are you prone to misplacing items more now than in the past?  Yes Can you tell me about some recent headlines?  A lot of people messing up, our President is doing a lot of visiting  Executive Are you having trouble managing financial matters?  No Are you taking your  medications regularly w/o prompting? Yes Can you multitask effectively?  No   Language Do you have any word finding difficulties?  No Do you have trouble following instructions or a conversation?  No Have you been avoiding reading or writing due to problems with recognition or remembering words?  No Are you using generalities when speaking because of memory trouble? No  Visuospatial Are you getting lost while driving?  No Are you getting turned around in your own home? No Do you have trouble recognizing familiar faces/family members/close friends? No Do you have trouble dressing, putting on cloths? No   Out-side paper records, electronic medical record, and images have been reviewed where available and summarized as:  MRI brain wo contrast 10/13/2010:  Mild chronic small vessel disease affecting the hemispheric white matter. No sign of acute or reversible process.  Lab Results  Component Value Date   TSH 4.77* 12/17/2015   Lab Results  Component Value Date   VITAMINB12 311 12/17/2015    Past Medical History  Diagnosis Date  . CORONARY ARTERY DISEASE     Lexiscan Myoview (10/14):  Low risk, inf thinning, no ischemia, EF 48%  . HEMORRHOIDS, HX OF   . HYPERLIPIDEMIA   . HYPERTENSION   . LABRYNTHITIS   . MYOCARDIAL INFARCTION, INFERIOR WALL, SUBSEQUENT CARE 06/11/2009  . PERCUTANEOUS TRANSLUMINAL CORONARY ANGIOPLASTY, HX OF 08/16/2007  . SYNCOPE, VASOVAGAL     Past Surgical History  Procedure Laterality Date  . Coronary artery  bypass graft      L internal mammary artery to L anterior, saphenous vein graft [Other]  . Correction of abdominal birth defect      Age 51     Medications:  Outpatient Encounter Prescriptions as of 04/01/2016  Medication Sig  . aspirin 81 MG tablet Take 81 mg by mouth daily.  . nitroGLYCERIN (NITROSTAT) 0.4 MG SL tablet Place 1 tablet (0.4 mg total) under the tongue every 5 (five) minutes as needed for chest pain.  . Omega-3 Fatty Acids (FISH  OIL) 1200 MG CAPS Take 1 capsule by mouth daily.    . rosuvastatin (CRESTOR) 20 MG tablet Take 1 tablet (20 mg total) by mouth daily.  . trandolapril (MAVIK) 2 MG tablet Take 1 tablet (2 mg total) by mouth daily.   No facility-administered encounter medications on file as of 04/01/2016.     Allergies: No Known Allergies  Family History: Family History  Problem Relation Age of Onset  . Coronary artery disease Father   . Osteoarthritis Father   . Parkinsonism Father   . Diverticulitis Mother   . Lung cancer Brother   . Colon cancer Neg Hx   . Prostate cancer Neg Hx   . Arrhythmia Mother   . Rheum arthritis Father     Social History: Social History  Substance Use Topics  . Smoking status: Former Smoker    Types: Cigarettes    Quit date: 06/25/1971  . Smokeless tobacco: Never Used  . Alcohol Use: No   Social History   Social History Narrative   HSG Married 1955. 1 son 68, 1 daughter 77, 3 grandchildren. Work full time on the farm, Manufacturing engineer. Marriage in good health. ACP - discussed with him and his wife. Referred to [email or web address].  Lives in a one story home.  Education: high school.    Review of Systems:  CONSTITUTIONAL: No fevers, chills, night sweats, or weight loss.   EYES: No visual changes or eye pain ENT: No hearing changes.  No history of nose bleeds.   RESPIRATORY: No cough, wheezing and shortness of breath.   CARDIOVASCULAR: Negative for chest pain, and palpitations.   GI: Negative for abdominal discomfort, blood in stools or black stools.  No recent change in bowel habits.   GU:  No history of incontinence.   MUSCLOSKELETAL: No history of joint pain or swelling.  No myalgias.   SKIN: Negative for lesions, rash, and itching.   HEMATOLOGY/ONCOLOGY: Negative for prolonged bleeding, bruising easily, and swollen nodes.  No history of cancer.   ENDOCRINE: Negative for cold or heat intolerance, polydipsia or goiter.   PSYCH:  No depression or  anxiety symptoms.   NEURO: As Above.   Vital Signs:  BP 140/70 mmHg  Pulse 54  Ht 5\' 7"  (1.702 m)  Wt 167 lb 2 oz (75.807 kg)  BMI 26.17 kg/m2  SpO2 97%   General Medical Exam:   General:  Well appearing, comfortable, blunted but smiles appropriately.   Eyes/ENT: see cranial nerve examination.   Neck: No masses appreciated.  Full range of motion without tenderness.  No carotid bruits. Respiratory:  Clear to auscultation, good air entry bilaterally.   Cardiac:  Regular rate and rhythm, no murmur.   Extremities:  No deformities, edema, or skin discoloration.  Skin:  No rashes or lesions.  Neurological Exam: MENTAL STATUS including orientation to time, place, person, recent and remote memory, attention span and concentration, language, and fund of knowledge is fair.  Speech is  not dysarthric. Montreal Cognitive Assessment  04/01/2016  Visuospatial/ Executive (0/5) 5  Naming (0/3) 3  Attention: Read list of digits (0/2) 2  Attention: Read list of letters (0/1) 1  Attention: Serial 7 subtraction starting at 100 (0/3) 3  Language: Repeat phrase (0/2) 1  Language : Fluency (0/1) 0  Abstraction (0/2) 1  Delayed Recall (0/5) 0  Orientation (0/6) 2  Total 18  Adjusted Score (based on education) 19    CRANIAL NERVES: II:  No visual field defects.  Unremarkable fundi.   III-IV-VI: Pupils equal round and reactive to light.  Normal conjugate, extra-ocular eye movements in all directions of gaze.  No nystagmus.  Right ptosis (old).   V:  Normal facial sensation.     VII:  Normal facial symmetry and movements.  Myerson's sign is positive.    VIII:  Normal hearing and vestibular function.   IX-X:  Normal palatal movement.   XI:  Normal shoulder shrug and head rotation.   XII:  Normal tongue strength and range of motion, no deviation or fasciculation.  MOTOR:  No atrophy, fasciculations or abnormal movements.  No pronator drift.  Tone is normal.    Right Upper Extremity:    Left Upper  Extremity:    Deltoid  5/5   Deltoid  5/5   Biceps  5/5   Biceps  5/5   Triceps  5/5   Triceps  5/5   Wrist extensors  5/5   Wrist extensors  5/5   Wrist flexors  5/5   Wrist flexors  5/5   Finger extensors  5/5   Finger extensors  5/5   Finger flexors  5/5   Finger flexors  5/5   Dorsal interossei  5/5   Dorsal interossei  5/5   Abductor pollicis  5/5   Abductor pollicis  5/5   Tone (Ashworth scale)  0  Tone (Ashworth scale)  0   Right Lower Extremity:    Left Lower Extremity:    Hip flexors  5/5   Hip flexors  5/5   Hip extensors  5/5   Hip extensors  5/5   Knee flexors  5/5   Knee flexors  5/5   Knee extensors  5/5   Knee extensors  5/5   Dorsiflexors  5/5   Dorsiflexors  5/5   Plantarflexors  5/5   Plantarflexors  5/5   Toe extensors  5/5   Toe extensors  5/5   Toe flexors  5/5   Toe flexors  5/5   Tone (Ashworth scale)  0+  Tone (Ashworth scale)  0+   MSRs:  Right                                                                 Left brachioradialis 2+  brachioradialis 2+  biceps 2+  biceps 2+  triceps 2+  triceps 2+  patellar 3+  patellar 3+  ankle jerk 2+  ankle jerk 2+  Hoffman no  Hoffman no  plantar response down  plantar response up   SENSORY:  Normal and symmetric perception of light touch, pinprick, vibration, and proprioception.  Romberg's sign absent.   COORDINATION/GAIT: Normal finger-to- nose-finger and heel-to-shin.  Intact rapid alternating movements bilaterally.  Able to rise from  a chair without using arms.  Gait narrow based and stable, except when turning he appears unsteady. Tandem and stressed gait intact.    IMPRESSION: Mr. Westgate is a very pleasant 82 year old gentleman referred for evaluation of memory loss.  His cognitive testing shows deficits predominately delayed recall and orientation.  He scored 19/30 on MoCA today.  With his history and exam, Alzheimer's dementia seems most likely.  I will obtain MRI brain to look for structural changes and  atrophy of the brain as well as set him up for neuropsychological testing.  He has bradycardia, so I will be very cautious using a anticholesterase inhibitor.    Of note, his exam also shows brisk lower extremity reflexes and increased tone of the legs which is most likely due to lumbar canal stenosis.  He does not have any weakness, low back pain, or paresthesias so will not pursue additional imaging.  However, should he develop any new lower extremity symptoms, MRI lumbar spine will be ordered.    PLAN/RECOMMENDATIONS:  1.  MRI brain without contrast 2.  Neuropsychological testing 3.  Check MMA 4.  Start vitamin B12 1092mcg daily for low-normal vitamin B12 level (311) 5.  Praised him and his wife for staying so active and able to manage a farm   Return to clinic in 2-3 months.   The duration of this appointment visit was 50 minutes of face-to-face time with the patient.  Greater than 50% of this time was spent in counseling, explanation of diagnosis, planning of further management, and coordination of care.   Thank you for allowing me to participate in patient's care.  If I can answer any additional questions, I would be pleased to do so.    Sincerely,    Terence Googe K. Posey Pronto, DO

## 2016-04-03 LAB — METHYLMALONIC ACID, SERUM: METHYLMALONIC ACID, QUANT: 109 nmol/L (ref 87–318)

## 2016-04-13 ENCOUNTER — Ambulatory Visit
Admission: RE | Admit: 2016-04-13 | Discharge: 2016-04-13 | Disposition: A | Discharge status: Home / Self Care | Payer: Medicare Other | Source: Ambulatory Visit | Attending: Neurology | Admitting: Neurology

## 2016-04-13 ENCOUNTER — Telehealth: Payer: Self-pay | Admitting: Neurology

## 2016-04-13 DIAGNOSIS — F0391 Unspecified dementia with behavioral disturbance: Secondary | ICD-10-CM

## 2016-04-13 DIAGNOSIS — F03918 Unspecified dementia, unspecified severity, with other behavioral disturbance: Secondary | ICD-10-CM

## 2016-04-13 DIAGNOSIS — F0390 Unspecified dementia without behavioral disturbance: Secondary | ICD-10-CM | POA: Diagnosis not present

## 2016-04-13 NOTE — Telephone Encounter (Signed)
Spencer Best April 30, 2033. His wife was returning a phone call. I did remind her of his appointment on Thursday 6/22. She would like you to please call her on  cell [phone] or home [phone]. She thought it may be related to MRI results? Thank you

## 2016-04-14 NOTE — Telephone Encounter (Signed)
Notified wife of results

## 2016-04-15 ENCOUNTER — Ambulatory Visit (INDEPENDENT_AMBULATORY_CARE_PROVIDER_SITE_OTHER): Payer: Medicare Other | Admitting: Psychology

## 2016-04-15 DIAGNOSIS — R413 Other amnesia: Secondary | ICD-10-CM | POA: Diagnosis not present

## 2016-04-15 NOTE — Progress Notes (Signed)
NEUROPSYCHOLOGICAL INTERVIEW (CPT: [id number])  Name: Spencer Best Date of Birth: 1933-09-22 Date of Interview: 04/15/2016  Reason for Referral:  Spencer Best is an 82 y.o., married male who is referred for neuropsychological evaluation by Dr. Narda Amber of [facility] due to concerns about memory loss. This patient is accompanied in the office by his wife who supplements the history.  History of Presenting Problem:  Spencer Best and his wife reported gradual onset of memory difficulties at least six months ago. Specifically, they reported some forgetfulness for recent conversations, misplacing/losing items, mild word finding difficulty, and uncertainty about directions when driving. His wife's biggest concern is that he is no longer able to navigate to places he knows well when he is driving, and he depends on her for this. There are no issues with his actual operation of a motor vehicle, but she fears he would become lost if she was not with him.   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 His memory difficulties do not interfere with his ability to manage the farm. He continues to manage his medications independently with no problems. He and his wife do the finances together. She has always done the cooking and tracking appointments for him.   The patient was seen by Dr. Posey Pronto for neurologic consultation on 04/01/2016 and scored 19/30 on the [facility]. An MRI of the brain was performed on 04/13/2016 and the following was reported: No acute infarct. Tiny areas of blood breakdown products greater in the frontal lobes suggestive of result of prior hemorrhagic ischemia. Remote right frontal lobe infarct. Moderate chronic microvascular changes. Mild global atrophy without hydrocephalus.  The patient had no complaints about physical functioning. He does not have any pain. He has a history of one fall about a year ago in the middle of the night in  his home (bathroom). He may have had a brief loss of consciousness. There were no acute changes in mental status. He has not had any recent difficulties with walking or balance.  His mood is even keel. He has no history of depression, anxiety or other psychiatric condition. His appetite is good, and he has no sleep difficulties. He does not drink alcohol or use tobacco.  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 Marital history: Married with two children and three grandchildre Alcohol/Tobacco/Substances: None  Medical History: Past Medical History  Diagnosis Date  . CORONARY ARTERY DISEASE     Lexiscan Myoview (10/14):  Low risk, inf thinning, no ischemia, EF 48%  . HEMORRHOIDS, HX OF   . HYPERLIPIDEMIA   . HYPERTENSION   . LABRYNTHITIS   . MYOCARDIAL INFARCTION, INFERIOR WALL, SUBSEQUENT CARE 06/11/2009  . PERCUTANEOUS TRANSLUMINAL CORONARY ANGIOPLASTY, HX OF 08/16/2007  . SYNCOPE, VASOVAGAL     Current Medications:  Outpatient Encounter Prescriptions as of 04/15/2016  Medication Sig  . aspirin 81 MG tablet Take 81 mg by mouth daily.  . nitroGLYCERIN (NITROSTAT) 0.4 MG SL tablet Place 1 tablet (0.4 mg total) under the tongue every 5 (five) minutes as needed for chest pain.  . Omega-3 Fatty Acids (FISH OIL) 1200 MG CAPS Take 1 capsule by mouth daily.    . rosuvastatin (CRESTOR) 20 MG tablet Take 1 tablet (20 mg total) by mouth daily.  . trandolapril (MAVIK) 2 MG tablet Take 1 tablet (2 mg total) by mouth daily.   No  facility-administered encounter medications on file as of 04/15/2016.    Behavioral Observations:   Appearance: Neatly dressed and groomed, appearing somewhat younger than his chronological age Gait: Ambulated independently, no abnormalities observed Speech: Fluent; slightly reduced volume and rate Thought process: Linear Affect: Full,  euthymic Interpersonal: Pleasant, appropriate   TESTING: There is medical necessity to proceed with neuropsychological assessment as the results will be used to aid in differential diagnosis and clinical decision-making and to inform specific treatment recommendations. Per the patient, his wife and medical records reviewed, there has been a change in cognitive functioning and a reasonable suspicion of dementia.   PLAN: The patient will return for a full battery of neuropsychological testing with a psychometrician under my supervision. Education regarding testing procedures was provided. Subsequently, the patient will see this provider for a follow-up session at which time his test performances and my impressions and treatment recommendations will be reviewed in detail.   Full neuropsychological evaluation report to follow.

## 2016-04-21 ENCOUNTER — Encounter: Payer: Self-pay | Admitting: Psychology

## 2016-04-22 ENCOUNTER — Ambulatory Visit (INDEPENDENT_AMBULATORY_CARE_PROVIDER_SITE_OTHER): Payer: Medicare Other | Admitting: Psychology

## 2016-04-22 DIAGNOSIS — R413 Other amnesia: Secondary | ICD-10-CM | POA: Diagnosis not present

## 2016-05-06 ENCOUNTER — Ambulatory Visit (INDEPENDENT_AMBULATORY_CARE_PROVIDER_SITE_OTHER): Payer: Medicare Other | Admitting: Psychology

## 2016-05-06 ENCOUNTER — Encounter: Payer: Self-pay | Admitting: Psychology

## 2016-05-06 DIAGNOSIS — G301 Alzheimer's disease with late onset: Secondary | ICD-10-CM

## 2016-05-06 DIAGNOSIS — F0280 Dementia in other diseases classified elsewhere without behavioral disturbance: Secondary | ICD-10-CM | POA: Diagnosis not present

## 2016-05-06 NOTE — Progress Notes (Signed)
NEUROPSYCHOLOGICAL EVALUATION   Name:    Spencer Best  Date of Birth:   [birth year] Date of Interview:  04/15/2016 Date of Testing:  04/22/2016   Date of Feedback:  05/06/2016     Background Information:  Reason for Referral:  Spencer Best is a 82 y.o., married male referred by Dr. Narda Amber to assess his current level of cognitive functioning and assist in differential diagnosis. The current evaluation consisted of a review of available medical records, an interview with the patient and his wife, and the completion of a neuropsychological testing battery. Informed consent was obtained.  History of Presenting Problem:  Spencer Best and his wife reported gradual onset of memory difficulties at least six months ago. Specifically, they reported some forgetfulness for recent conversations, misplacing/losing items, mild word finding difficulty, and uncertainty about directions when driving. His wife's biggest concern is that he is no longer able to navigate to places he knows well when he is driving, and he depends on her for this. There are no issues with his actual operation of a motor vehicle, but she fears he would become lost if she was not with him.   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 His memory difficulties do not interfere with his ability to manage the farm. He continues to manage his medications independently with no problems. He and his wife do the finances together. She has always done the cooking and tracking appointments for him.   The patient was seen by Dr. Posey Pronto for neurologic consultation on 04/01/2016 and scored 19/30 on the [facility]. An MRI of the brain was performed on 04/13/2016 and the following was reported: No acute infarct. Tiny areas of blood breakdown products greater in the frontal lobes suggestive of result of prior hemorrhagic ischemia. Remote right frontal lobe infarct. Moderate chronic microvascular  changes. Mild global atrophy without hydrocephalus.  The patient had no complaints about physical functioning. He does not have any pain. He has a history of one fall about a year ago in the middle of the night in his home (bathroom). He may have had a brief loss of consciousness. There were no acute changes in mental status. He has not had any recent difficulties with walking or balance.  His mood is even keel. He has no history of depression, anxiety or other psychiatric condition. His appetite is good, and he has no sleep difficulties. He does not drink alcohol or use tobacco.  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 Marital history: Married with two children and three grandchildre Alcohol/Tobacco/Substances: None   Medical History:  Past Medical History  Diagnosis Date  . CORONARY ARTERY DISEASE     Lexiscan Myoview (10/14):  Low risk, inf thinning, no ischemia, EF 48%  . HEMORRHOIDS, HX OF   . HYPERLIPIDEMIA   . HYPERTENSION   . LABRYNTHITIS   . MYOCARDIAL INFARCTION, INFERIOR WALL, SUBSEQUENT CARE 06/11/2009  . PERCUTANEOUS TRANSLUMINAL CORONARY ANGIOPLASTY, HX OF 08/16/2007  . SYNCOPE, VASOVAGAL     Current medications:  Outpatient Encounter Prescriptions as of 05/06/2016  Medication Sig  . aspirin 81 MG tablet Take 81 mg by mouth daily.  . nitroGLYCERIN (NITROSTAT) 0.4 MG SL tablet Place 1 tablet (0.4 mg total) under the tongue every 5 (five) minutes as needed for chest pain.  . Omega-3 Fatty Acids (FISH OIL) 1200 MG CAPS  Take 1 capsule by mouth daily.    . rosuvastatin (CRESTOR) 20 MG tablet Take 1 tablet (20 mg total) by mouth daily.  . trandolapril (MAVIK) 2 MG tablet Take 1 tablet (2 mg total) by mouth daily.   No facility-administered encounter medications on file as of 05/06/2016.    Current Examination:  Behavioral Observations:   Appearance: Neatly dressed and groomed, appearing somewhat younger than his chronological age Gait: Ambulated independently, no abnormalities observed Speech: Fluent; slightly reduced volume and rate Thought process: Linear Affect: Full, euthymic Interpersonal: Pleasant, appropriate Orientation: Oriented to person, place and some aspects of time (month); disoriented to date, year and day of the week.   Tests Administered: . Test of Premorbid Functioning (TOPF) . Wechsler Adult Intelligence Scale-Fourth Edition (WAIS-IV): Similarities, Block Design, Matrix Reasoning, Coding and Digit Span subtests . [employer] Verbal Learning Test - 2nd Edition (CVLT-2) Short Form . Repeatable Battery for the Assessment of Neuropsychological Status (RBANS) Form A:  Figure Copy and Recall subtests and Story Memory and Recall subtests . Neuropsychological Assessment Battery (NAB) Language Module, Form 1: Auditory Comprehension and Naming Subtests . Controlled Oral Word Association Test (COWAT) . Trail Making Test A and B . Clock drawing test . Geriatric Depression Scale (GDS) 15 Item  Test Results: Note: Standardized scores are presented only for use by appropriately trained professionals and to allow for any future test-retest comparison. These scores should not be interpreted without consideration of all the information that is contained in the rest of the report. The most recent standardization samples from the test publisher or other sources were used whenever possible to derive standard scores; scores were corrected for age, gender, ethnicity and education when available.   Test Scores:  Test Name Standardized Score Descriptor  TOPF SS= 84 Low average  WAIS-IV Subtests    Similarities ss= 6 Low average  Block Design ss= 11 Average  Matrix Reasoning ss= 10 Average  Coding ss= 7 Low average  Digit Span  ss= 8 Low end of average  RBANS Subtests    Figure Copy Z= -0.2 Average  Figure Recall Z= -1.6  Borderline  Story Memory Z= -1.6 Borderline  Story Recall Z= -2.6 Severely impaired  CVLT-II Scores    Trial 1 Z= -3.5 Severely impaired  Trial 4 Z= -2.5 Impaired  Trials 1-4 total T= 16 Severely impaired  SD Free Recall Z= -2.5 Impaired  LD Free Recall Z= -2.5 Impaired  LD Cued Recall Z= -3.5 Severely impaired  Recognition Discriminability (9/9 hits, 5 false positives) Z= -0.5 Average  NAB Language Subtests    Auditory Comprehension T= 59 High average  Naming T= 40 Low average  COWAT-FAS T= 34 Borderline  COWAT-Animals T= 27 Impaired  Trail Making Test A 0 errors T= 40 Low average  Trail Making Test B 1 error T= 33 Borderline  Clock Drawing  WNL   GDS-15 0/15 WNL              Description of Test Results:  Premorbid verbal intellectual abilities were estimated to have been within the low average range based on a test of word reading. Psychomotor processing speed was low average. Auditory attention and working memory were average. Visual-spatial construction was average. Language abilities were variable. Auditory comprehension was high average. Confrontation naming was low average. Semantic verbal fluency was impaired. With regard to verbal memory, encoding and acquisition of non-contextual information (i.e., word list) was severely impaired across four learning trials. After a brief distracter task, free recall was  impaired with 1/9 items recalled. After a delay, free recall again was impaired (0/9 items recalled), and he did not benefit from semantic cueing. His performance on a yes/no recognition task was within normal limits although he did commit several false positive errors. On another verbal memory test, encoding and acquisition of contextual auditory information (i.e., short story) was borderline impaired across two learning trials. After a delay, free recall was severely impaired (with no aspects of the story recalled). With regard to non-verbal memory, delayed free  recall of visual information was borderline impaired. Executive functioning was somewhat variable. Mental flexibility and set-shifting were borderline impaired on Trails B. Verbal fluency with phonemic search restrictions was borderline impaired. Performance on a clock drawing task was intact. Verbal abstract reasoning was low average, and non-verbal abstract reasoning was average. On a self-report questionnaire, the patient's responses were not indicative of clinically significant depression.  Clinical Impression: Mild dementia most likely secondary to Alzheimer's disease. Results of the current cognitive evaluation reveal prominent impairment in verbal memory encoding and retrieval, as well as semantic retrieval. He also demonstrated mild difficulty with mental flexibility/set-shifting. There is evidence that his cognitive deficits are beginning to interfere with his ability to manage complex tasks, such as driving (i.e., navigation). As such, diagnostic criteria for a dementia syndrome are met.   The patient's cognitive profile is suggestive of medial-temporal lobe involvement. Alzheimer's disease is the most likely etiology, given his cognitive profile and clinical features. However, a superimposed vascular etiology cannot be ruled out given neuroimaging results showing moderate chronic microvascular ischemia and remote infarcts. Fortunately, there is no evidence of depression or anxiety, or behavioral disturbance. At this time, I would characterize his dementia as clearly in the mild range.   Recommendations: Based on the findings of the present evaluation, the following recommendations are offered:  1. Spencer Best is considered an appropriate candidate for cholinesterase inhibitor therapy. Of course, all decisions with regard to medications are deferred to his neurologist. 2. The patient should continue to receive assistance with complex ADLs, including finances and navigation while driving. He  should not drive alone. Finally, his wife should monitor his medications to make sure they are being taken correctly. 3. Education regarding Alzheimer's disease will be provided to the patient and his wife.  4. The patient should continue to participate in activities which provide mental stimulation, safe cardiovascular exercise, and social interaction. 5. Neuropsychological re-assessment in one year is recommended in order to monitor cognitive status, track progression of symptoms and further assist with treatment planning.   Feedback to Patient: Spencer Best and his wife returned for a feedback appointment on 05/06/2016 to review the results of his neuropsychological evaluation with this provider. 30 minutes face-to-face time was spent reviewing his test results, my impressions and my recommendations as detailed above.    Total time spent on this patient's case: 90791x1 unit for interview with psychologist; [phone] units of testing by psychometrician under psychologist's supervision; [phone] units for medical record review, administration and scoring of neuropsychological tests, interpretation of test results, preparation of this report, and review of results to the patient by psychologist.     Thank you for your referral of Spencer Best. Please feel free to contact me if you have any questions or concerns regarding this report.

## 2016-06-04 NOTE — Progress Notes (Signed)
   Neuropsychology Note  Spencer Best returned on 04/22/2016 for 2 hours of neuropsychological testing with technician, Milana Kidney, BS, under the supervision of Dr. Macarthur Critchley. The patient did not appear overtly distressed by the testing session, per behavioral observation or via self-report to the technician. Rest breaks were offered. Spencer Best will return within 2 weeks for a feedback session with Dr. Si Raider at which time his test performances, clinical impressions and treatment recommendations will be reviewed in detail. The patient understands he can contact our office should he require our assistance before this time.  Full report to follow.

## 2016-06-10 NOTE — Progress Notes (Signed)
   Neuropsychology Note  Spencer Best returned today for 2 hours of neuropsychological testing with technician, Milana Kidney, BS, under the supervision of Dr. Macarthur Critchley. The patient did not appear overtly distressed by the testing session, per behavioral observation or via self-report to the technician. Rest breaks were offered. Spencer Best will return within 2 weeks for a feedback session with Dr. Si Raider at which time his test performances, clinical impressions and treatment recommendations will be reviewed in detail. The patient understands he can contact our office should he require our assistance before this time.  Full report to follow.

## 2016-06-23 ENCOUNTER — Ambulatory Visit (INDEPENDENT_AMBULATORY_CARE_PROVIDER_SITE_OTHER): Payer: Medicare Other | Admitting: Neurology

## 2016-06-23 ENCOUNTER — Encounter: Payer: Self-pay | Admitting: Neurology

## 2016-06-23 VITALS — BP 140/70 | HR 67 | Ht 67.0 in | Wt 167.4 lb

## 2016-06-23 DIAGNOSIS — F0280 Dementia in other diseases classified elsewhere without behavioral disturbance: Secondary | ICD-10-CM

## 2016-06-23 DIAGNOSIS — G301 Alzheimer's disease with late onset: Secondary | ICD-10-CM

## 2016-06-23 DIAGNOSIS — R292 Abnormal reflex: Secondary | ICD-10-CM

## 2016-06-23 MED ORDER — MEMANTINE HCL 28 X 5 MG & 21 X 10 MG PO TABS
ORAL_TABLET | ORAL | 0 refills | Status: DC
Start: 2016-06-23 — End: 2016-07-19

## 2016-06-23 NOTE — Patient Instructions (Addendum)
1.  Start Namenda titration pack as instructed 2.  Call my office in 3 weeks and we will send a new prescription for Namenda 10mg  tablets to take twice daily 3.  Continue vitamin B12 1039mcg daily  [facility]:  [phone] Tel [place]:   [phone] [email or web address]    Return to clinic in 6 months

## 2016-06-23 NOTE — Progress Notes (Signed)
Follow-up Visit   Date: 06/23/16    Spencer Best MRN: [id number] DOB: 08/05/1933   Interim History: Spencer Best is a 82 y.o. right-handed Caucasian male with CAD, hyperlipidemia, hypertension, and vasovagal syncope returning to the clinic for follow-up of Alzheimer's disease.  The patient was accompanied to the clinic by wife who also provides collateral information.    History of present illness: Late 2016, wife noticed that he would frequently ask her about doctor appointments, misplaces objects, and frequently forgets the date. He has problems with short-term memory moreso than long-term.  He continues to drive and has not been involved in any MVAs.  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  He usually always travels with his wife and she had does not endorse any safety issues with driving.  He manages household finances, but his wife does oversee it.  He does is own ADLs and walks independently.    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  He reports forgetting to chores on his farms.  He continues to be very active on the farm - moves the cattle every 2-3 days, mowing, places dividers in the farm, goes out to eat.    UPDATE 06/23/2016:  His neuropsychological testing confirming Alzheimer's dementia (mild).  He has no new complaints or changes with his memory since he was last here in June.  Wife has noticed that he often forgets to pay the bills and often has to remind him of this.  She does not have any issues with driving safety and reports that he can get lost sometimes, but they always try to travel together. Overall, he is still able to do all his IADLS and ADLs.      Medications:  Current Outpatient Prescriptions on File Prior to Visit  Medication Sig Dispense Refill  . aspirin 81 MG tablet Take 81 mg by mouth daily.    . nitroGLYCERIN  (NITROSTAT) 0.4 MG SL tablet Place 1 tablet (0.4 mg total) under the tongue every 5 (five) minutes as needed for chest pain. 25 tablet 3  . Omega-3 Fatty Acids (FISH OIL) 1200 MG CAPS Take 1 capsule by mouth daily.      . rosuvastatin (CRESTOR) 20 MG tablet Take 1 tablet (20 mg total) by mouth daily. 90 tablet 3  . trandolapril (MAVIK) 2 MG tablet Take 1 tablet (2 mg total) by mouth daily. 90 tablet 3   No current facility-administered medications on file prior to visit.     Allergies: No Known Allergies  Review of Systems:  CONSTITUTIONAL: No fevers, chills, night sweats, or weight loss.  EYES: No visual changes or eye pain ENT: No hearing changes.  No history of nose bleeds.   RESPIRATORY: No cough, wheezing and shortness of breath.   CARDIOVASCULAR: Negative for chest pain, and palpitations.   GI: Negative for abdominal discomfort, blood in stools or black stools.  No recent change in bowel habits.   GU:  No history of incontinence.   MUSCLOSKELETAL: No history of joint pain or swelling.  No myalgias.   SKIN: Negative for lesions, rash, and itching.   ENDOCRINE: Negative for cold or heat intolerance, polydipsia or goiter.   PSYCH:  No depression or anxiety symptoms.   NEURO: As Above.   Vital Signs:  BP 140/70   Pulse 67  Ht 5\' 7"  (1.702 m)   Wt 167 lb 7 oz (75.9 kg)   SpO2 98%   BMI 26.22 kg/m  Pain Scale: 0 on a scale of 0-10   Neurological Exam: MENTAL STATUS including orientation to time, place, person, recent and remote memory, attention span and concentration, language, and fund of knowledge is fairly intact.  Blunted affect and reduce spontaneous speech.  CRANIAL NERVES: Pupils equal round and reactive to light.  Normal conjugate, extra-ocular eye movements in all directions of gaze.  Right ptosis (old). Normal facial sensation.  Face is symmetric. Palate elevates symmetrically.  Tongue is midline.  MOTOR:  Motor strength is 5/5 in all extremities.  No atrophy,  fasciculations or abnormal movements.  No pronator drift.  Tone is slightly increased in the legs.    MSRs:  Reflexes are 2+/4 throughout, except 3+ bilateral patella jerks.  Extensor plantar response on the left only.  COORDINATION/GAIT:   Gait narrow based and stable, mild unsteadiness with turning  Data: Lab Results  Component Value Date   TSH 4.77 (H) 12/17/2015   Lab Results  Component Value Date   VITAMINB12 311 12/17/2015   MRI brain wo contrast 04/13/2016: No acute infarct. Tiny areas of blood breakdown products greater in the frontal lobes suggestive of result of prior hemorrhagic ischemia. Remote right frontal lobe infarct. Moderate chronic microvascular changes. Thin-section imaging through the internal auditory canal was not performed and contrast not administered. Taking this limitation into account, no obvious internal auditory canal lesion is identified. Mild global atrophy without hydrocephalus.  IMPRESSION/PLAN: 1.  Alzheimer's dementia, mild confirmed by neuropsychological testing  - Discussed diagnosis at length including natural progression of disease and management options  - He has bradycardia, so I will be very cautious using a anticholesterase inhibitor.    - Instead, I will start namenda 5mg  and titrate to 10mg  BID  - Continue vitamin B12 1026mcg daily  - No home safety issues at this time  - Wife feels safe with him driving, sometimes he can get disoriented, but she is mostly with him   - Wife will oversee that bills are paid on time as sometimes he tends to forget this  - Encouraged him to revisit his Living Will and Advanced Directives to be sure information is up-to-date.    - They have many local resources as they also were the primary caregiver of patient's mother-in-law who had Alzheimer's dementia  2.  Hyperreflexia likely due to lumbar canal stenosis, asymptomatic.  - Should he develop weakness, low back pain, or paresthesias, MRI lumbar spine can  be ordered  Return to clinic in 6 months   The duration of this appointment visit was 25 minutes of face-to-face time with the patient.  Greater than 50% of this time was spent in counseling, explanation of diagnosis, planning of further management, and coordination of care.   Thank you for allowing me to participate in patient's care.  If I can answer any additional questions, I would be pleased to do so.    Sincerely,    Donika K. Posey Pronto, DO

## 2016-07-19 ENCOUNTER — Telehealth: Payer: Self-pay | Admitting: Neurology

## 2016-07-19 ENCOUNTER — Other Ambulatory Visit: Payer: Self-pay | Admitting: *Deleted

## 2016-07-19 MED ORDER — MEMANTINE HCL 10 MG PO TABS: 10.0000 mg | ORAL_TABLET | Freq: Two times a day (BID) | ORAL | 3 refills | Status: DC

## 2016-07-19 NOTE — Telephone Encounter (Signed)
Patient is done with his titration pack.  Is the maintenance dose 28 mg?

## 2016-07-19 NOTE — Telephone Encounter (Signed)
Done

## 2016-07-19 NOTE — Telephone Encounter (Signed)
Namenda 10mg  BID has been sent.  Please inform patient - thanks.

## 2016-07-19 NOTE — Telephone Encounter (Signed)
PT called and said they needed a prescription called in/Dawn CB# [phone]

## 2016-07-28 DIAGNOSIS — D225 Melanocytic nevi of trunk: Secondary | ICD-10-CM | POA: Diagnosis not present

## 2016-07-28 DIAGNOSIS — L821 Other seborrheic keratosis: Secondary | ICD-10-CM | POA: Diagnosis not present

## 2016-08-11 DIAGNOSIS — H40012 Open angle with borderline findings, low risk, left eye: Secondary | ICD-10-CM | POA: Diagnosis not present

## 2016-08-11 DIAGNOSIS — H2513 Age-related nuclear cataract, bilateral: Secondary | ICD-10-CM | POA: Diagnosis not present

## 2016-08-11 DIAGNOSIS — Z23 Encounter for immunization: Secondary | ICD-10-CM | POA: Diagnosis not present

## 2016-09-06 ENCOUNTER — Telehealth: Payer: Self-pay | Admitting: Neurology

## 2016-09-06 NOTE — Telephone Encounter (Signed)
Informed Mrs.  Leyendecker that usually the pharmacy will fax [place] a PA request.  She will call pharmacy and request one.

## 2016-09-06 NOTE — Telephone Encounter (Signed)
Spencer Best 2033-01-10. His wife Shirlee Limerick called regarding his new Insurance with [employer]  [phone] or 46 she was unsure? The medication is Memantine. They need authorization from Dr. Posey Pronto. His wife's home # is [phone] or cell [phone]. Thank you

## 2016-09-22 ENCOUNTER — Encounter: Payer: Self-pay | Admitting: Internal Medicine

## 2016-10-15 ENCOUNTER — Telehealth: Payer: Self-pay | Admitting: Neurology
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
## 2016-10-19 NOTE — Telephone Encounter (Signed)
Yes, okay to schedule sooner f/u.

## 2016-10-19 NOTE — Telephone Encounter (Signed)
Patient comes in on 12-20-16.  Do you want me to try and move this up?

## 2016-10-20 NOTE — Telephone Encounter (Signed)
Patient's daughter is not listed as someone I can talk to and the wife is in denial about patient's condition.  Patient is returning to see [place] in February.

## 2016-10-29 ENCOUNTER — Ambulatory Visit: Payer: Medicare Other | Admitting: Cardiology

## 2016-12-03 ENCOUNTER — Telehealth: Payer: Self-pay | Admitting: Neurology
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
## 2016-12-05 NOTE — Progress Notes (Signed)
Cardiology Office Note   Date:  12/07/2016   ID:  Spencer Best, DOB 1932-11-17, MRN [id number]  PCP:  Hoyt Koch, MD  Cardiologist:   Minus Breeding, MD   c Chief Complaint  Patient presents with  . Coronary Artery Disease    History of Present Illness: Spencer Best is a 83 y.o. male who presents for follow up of CAD with bypass surgery in 1994. In January 2016 he had a syncopal episode and was in the emergency room. No etiology was identified. He subsequently had a stress perfusion study which demonstrated a fixed inferior and apical defect with an EF of 53% which was felt to be low risk. He did wear a monitor which demonstrated no significant arrhythmias.   He was last seen in this office in 08/2015 and was mostly complaining of balance issues.    He otherwise is doing well.  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  He goes to the [facility].   The patient denies any new symptoms such as chest discomfort, neck or arm discomfort. There has been no new shortness of breath, PND or orthopnea. There have been no reported palpitations, presyncope or syncope.  Past Medical History:  Diagnosis Date  . HEMORRHOIDS, HX OF   . HYPERLIPIDEMIA   . HYPERTENSION   . LABRYNTHITIS   . MYOCARDIAL INFARCTION, INFERIOR WALL, SUBSEQUENT CARE 06/11/2009  . PERCUTANEOUS TRANSLUMINAL CORONARY ANGIOPLASTY, HX OF 08/16/2007  . SYNCOPE, VASOVAGAL     Past Surgical History:  Procedure Laterality Date  . CORONARY ARTERY BYPASS GRAFT     L internal mammary artery to L anterior, saphenous vein graft [Other]  . correction of abdominal birth defect     Age 46     Current Outpatient Prescriptions  Medication Sig Dispense Refill  . aspirin 81 MG tablet Take 81 mg by mouth daily.    . cyanocobalamin 1000 MCG tablet Take 1,000 mcg by mouth daily.    . memantine (NAMENDA) 10 MG tablet Take 1 tablet (10 mg total) by mouth 2 (two) times daily. 180 tablet 3  . nitroGLYCERIN (NITROSTAT) 0.4 MG SL tablet  Place 1 tablet (0.4 mg total) under the tongue every 5 (five) minutes as needed for chest pain. 25 tablet 3  . Omega-3 Fatty Acids (FISH OIL) 1200 MG CAPS Take 1 capsule by mouth daily.      . rosuvastatin (CRESTOR) 20 MG tablet Take 1 tablet (20 mg total) by mouth daily. 90 tablet 3  . trandolapril (MAVIK) 2 MG tablet Take 1 tablet (2 mg total) by mouth daily. 90 tablet 3   No current facility-administered medications for this visit.     Allergies:   Patient has no known allergies.    ROS:  Please see the history of present illness.   Otherwise, review of systems are positive for none.   All other systems are reviewed and negative.    PHYSICAL EXAM: VS:  BP 132/64   Pulse 60   Ht 5\' 6"  (1.676 m)   Wt 171 lb (77.6 kg)   BMI 27.60 kg/m  , BMI Body mass index is 27.6 kg/m. GENERAL:  Well appearing NECK:  No jugular venous distention, waveform within normal limits, carotid upstroke brisk and symmetric, no bruits, no thyromegaly LYMPHATICS:  No cervical, inguinal adenopathy LUNGS:  Clear to auscultation bilaterally BACK:  No CVA tenderness CHEST:  Unremarkable HEART:  PMI not displaced or sustained,S1 and S2 within normal limits, no S3, no  S4, no clicks, no rubs, no murmurs ABD:  Flat, positive bowel sounds normal in frequency in pitch, no bruits, no rebound, no guarding, no midline pulsatile mass, no hepatomegaly, no splenomegaly EXT:  2 plus pulses throughout, no edema, no cyanosis no clubbing    EKG:  EKG is ordered today. The ekg ordered today demonstrates Sinus rhythm, rate 59, axis within normal limits, intervals within normal limits, poor anterior R wave progression, no acute ST-T wave changes area   Recent Labs: 12/17/2015: ALT 14; BUN 12; Creatinine, Ser 0.86; Hemoglobin 12.1; Platelets 216.0; Potassium 4.6; Sodium 133; TSH 4.77    Lipid Panel    Component Value Date/Time   CHOL 123 12/17/2015 1110   TRIG 94.0 12/17/2015 1110   HDL 47.60 12/17/2015 1110   CHOLHDL 3  12/17/2015 1110   VLDL 18.8 12/17/2015 1110   LDLCALC 56 12/17/2015 1110      Wt Readings from Last 3 Encounters:  12/06/16 171 lb (77.6 kg)  06/23/16 167 lb 7 oz (75.9 kg)  04/01/16 167 lb 2 oz (75.8 kg)      Other studies Reviewed: Additional studies/ records that were reviewed today include: None. Review of the above records demonstrates:  Please see elsewhere in the note.     ASSESSMENT AND PLAN:  CAD:  The patient has no new sypmtoms.  No further cardiovascular testing is indicated.  We will continue with aggressive risk reduction and meds as listed.  SYNCOPE:  He has had no further syncope.  No change in therapy is indicated.   HYPERLIPIDEMIA:   His lipids are excellent.  No change in therapy is planned.   HTN:  The blood pressure is at target. No change in medications is indicated. We will continue with therapeutic lifestyle changes (TLC).   Current medicines are reviewed at length with the patient today.  The patient does not have concerns regarding medicines.  The following changes have been made:  no change  Labs/ tests ordered today include: None  Orders Placed This Encounter  Procedures  . EKG 12-Lead     Disposition:   FU with me in one year.     Signed, Minus Breeding, MD  12/07/2016 12:41 PM    [facility]

## 2016-12-06 ENCOUNTER — Encounter: Payer: Self-pay | Admitting: Cardiology

## 2016-12-06 ENCOUNTER — Ambulatory Visit (INDEPENDENT_AMBULATORY_CARE_PROVIDER_SITE_OTHER): Payer: Medicare Other | Admitting: Cardiology

## 2016-12-06 VITALS — BP 132/64 | HR 60 | Ht 66.0 in | Wt 171.0 lb

## 2016-12-06 DIAGNOSIS — I10 Essential (primary) hypertension: Secondary | ICD-10-CM

## 2016-12-06 DIAGNOSIS — I2510 Atherosclerotic heart disease of native coronary artery without angina pectoris: Secondary | ICD-10-CM

## 2016-12-06 MED ORDER — NITROGLYCERIN 0.4 MG SL SUBL: 0.4000 mg | SUBLINGUAL_TABLET | SUBLINGUAL | 3 refills | Status: DC | PRN

## 2016-12-06 NOTE — Telephone Encounter (Signed)
Noted  

## 2016-12-06 NOTE — Patient Instructions (Addendum)

## 2016-12-07 ENCOUNTER — Encounter: Payer: Self-pay | Admitting: Cardiology

## 2016-12-20 ENCOUNTER — Encounter: Payer: Self-pay | Admitting: Neurology

## 2016-12-20 ENCOUNTER — Ambulatory Visit (INDEPENDENT_AMBULATORY_CARE_PROVIDER_SITE_OTHER): Payer: Medicare Other | Admitting: Neurology

## 2016-12-20 ENCOUNTER — Other Ambulatory Visit: Payer: Self-pay | Admitting: Internal Medicine

## 2016-12-20 VITALS — BP 130/80 | HR 57 | Ht 66.0 in | Wt 174.4 lb

## 2016-12-20 DIAGNOSIS — F0280 Dementia in other diseases classified elsewhere without behavioral disturbance: Secondary | ICD-10-CM | POA: Diagnosis not present

## 2016-12-20 DIAGNOSIS — I2510 Atherosclerotic heart disease of native coronary artery without angina pectoris: Secondary | ICD-10-CM

## 2016-12-20 DIAGNOSIS — G301 Alzheimer's disease with late onset: Secondary | ICD-10-CM

## 2016-12-20 NOTE — Progress Notes (Signed)
Follow-up Visit   Date: 12/20/16    Spencer Best MRN: [id number] DOB: [birth year]   Interim History: Spencer Best is a 83 y.o. right-handed Caucasian male with CAD, hyperlipidemia, hypertension, and vasovagal syncope returning to the clinic for follow-up of Alzheimer's disease.  The patient was accompanied to the clinic by wife who also provides collateral information.    History of present illness: Late 2016, wife noticed that he would frequently ask her about doctor appointments, misplaces objects, and frequently forgets the date. He has problems with short-term memory moreso than long-term.  He continues to drive and has not been involved in any MVAs.  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  He usually always travels with his wife and she had does not endorse any safety issues with driving.  He manages household finances, but his wife does oversee it.  He does is own ADLs and walks independently.    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  He reports forgetting to chores on his farms.  He continues to be very active on the farm - moves the cattle every 2-3 days, mowing, places dividers in the farm, goes out to eat.    UPDATE 06/23/2016:  His neuropsychological testing confirming Alzheimer's dementia (mild).  He has no new complaints or changes with his memory since he was last here in June.  Wife has noticed that he often forgets to pay the bills and often has to remind him of this.  She does not have any issues with driving safety and reports that he can get lost sometimes, but they always try to travel together. Overall, he is still able to do all his IADLS and ADLs.      UPDATE 12/20/2016:  He is here for 64-month appointment.  He denies any new neurological complaints.  Wife feels that his memory deficits are stable.  He is still active on the farm and  she allows him to drive locally to familiar places, but anything long distance, she always accompanies him.  He has not got lost lately.  They manages the finances together now.  She does not have any home safety issues. He can be off balance sometimes and she encouraged him to use a cane, but is noncompliant. Fortunately, he has not had any interval falls. Mood and sleep is good.  They sold some of their cattle and now have 20 cows which they manage together. Wife states that they are trying to limit the amount of farm work, but keeping the cattle allows them to claim farmland on their 80+ acres.   Medications:  Current Outpatient Prescriptions on File Prior to Visit  Medication Sig Dispense Refill  . aspirin 81 MG tablet Take 81 mg by mouth daily.    . cyanocobalamin 1000 MCG tablet Take 1,000 mcg by mouth daily.    . memantine (NAMENDA) 10 MG tablet Take 1 tablet (10 mg total) by mouth 2 (two) times daily. 180 tablet 3  . nitroGLYCERIN (NITROSTAT) 0.4 MG SL tablet Place 1 tablet (0.4 mg total) under the tongue every 5 (five) minutes as needed for chest pain. 25 tablet 3  . Omega-3 Fatty Acids (FISH OIL) 1200 MG CAPS Take 1 capsule by mouth daily.       No current facility-administered medications on file prior to visit.  Allergies: No Known Allergies  Review of Systems:  CONSTITUTIONAL: No fevers, chills, night sweats, or weight loss.  EYES: No visual changes or eye pain ENT: No hearing changes.  No history of nose bleeds.   RESPIRATORY: No cough, wheezing and shortness of breath.   CARDIOVASCULAR: Negative for chest pain, and palpitations.   GI: Negative for abdominal discomfort, blood in stools or black stools.  No recent change in bowel habits.   GU:  No history of incontinence.   MUSCLOSKELETAL: No history of joint pain or swelling.  No myalgias.   SKIN: Negative for lesions, rash, and itching.   ENDOCRINE: Negative for cold or heat intolerance, polydipsia or goiter.   PSYCH:  No  depression or anxiety symptoms.   NEURO: As Above.   Vital Signs:  BP 130/80   Pulse (!) 57   Ht 5\' 6"  (1.676 m)   Wt 174 lb 7 oz (79.1 kg)   SpO2 96%   BMI 28.15 kg/m  Pain Scale: 0 on a scale of 0-10   Neurological Exam: MENTAL STATUS including orientation to time, place, person, recent and remote memory, attention span and concentration, language, and fund of knowledge is fairly intact.  Blunted affect and reduce spontaneous speech.  CRANIAL NERVES: Pupils equal round and reactive to light.  Normal conjugate, extra-ocular eye movements in all directions of gaze.  Right ptosis (old).  Face is symmetric.  MOTOR:  Motor strength is 5/5 in all extremities.  No atrophy, fasciculations or abnormal movements.  No pronator drift.  Tone is slightly increased in the legs.    MSRs:  Reflexes are 2+/4 throughout, except 3+ bilateral patella jerks.    COORDINATION/GAIT:   Gait slightly wide-based and stable, mild unsteadiness with turning  Data: Lab Results  Component Value Date   TSH 4.77 (H) 12/17/2015   Lab Results  Component Value Date   VITAMINB12 311 12/17/2015   MRI brain wo contrast 04/13/2016: No acute infarct. Tiny areas of blood breakdown products greater in the frontal lobes suggestive of result of prior hemorrhagic ischemia. Remote right frontal lobe infarct. Moderate chronic microvascular changes. Thin-section imaging through the internal auditory canal was not performed and contrast not administered. Taking this limitation into account, no obvious internal auditory canal lesion is identified. Mild global atrophy without hydrocephalus.   IMPRESSION/PLAN: 1.  Alzheimer's dementia, mild confirmed by neuropsychological testing  - He has bradycardia, so I will be very cautious using a anticholesterase inhibitor.    - Continue namenda 10mg  BID  - Continue vitamin B12 1060mcg daily  - Wife feels safe with him driving, sometimes he can get disoriented, but she is mostly  with him.  Precautions discussed to limit driving to daytime, locally only, and when traffic is less.    - Encouraged patient and his wife to hire help to manage farm duties.  They have a son in [place] but he stays very busy with his own work so cannot help   - Strongly encouraged to use a cane for gait assistance  2.  Hyperreflexia likely due to lumbar canal stenosis, asymptomatic.  - MRI lumbar spine can be ordered if he develops weakness or pain  Return to clinic in 1 year   The duration of this appointment visit was 20 minutes of face-to-face time with the patient.  Greater than 50% of this time was spent in counseling, explanation of diagnosis, planning of further management, and coordination of care.   Thank you for allowing me to participate  in patient's care.  If I can answer any additional questions, I would be pleased to do so.    Sincerely,    Donika K. Posey Pronto, DO

## 2016-12-20 NOTE — Patient Instructions (Addendum)
1.  Continue Namenda 10mg  twice daily 2.  Encouraged to use a cane 3.  Limit driving to daytime, locally only, and when traffic is less.    Return to clinic in 1 year

## 2016-12-20 NOTE — Telephone Encounter (Signed)
Contacted patient and stated that he needs visit for more refills

## 2016-12-22 ENCOUNTER — Ambulatory Visit: Payer: Medicare Other | Admitting: Neurology

## 2016-12-23 ENCOUNTER — Encounter: Payer: Self-pay | Admitting: Internal Medicine

## 2016-12-23 ENCOUNTER — Ambulatory Visit (INDEPENDENT_AMBULATORY_CARE_PROVIDER_SITE_OTHER): Payer: Medicare Other | Admitting: Internal Medicine

## 2016-12-23 ENCOUNTER — Other Ambulatory Visit (INDEPENDENT_AMBULATORY_CARE_PROVIDER_SITE_OTHER): Payer: Medicare Other

## 2016-12-23 VITALS — BP 150/62 | HR 61 | Temp 97.8°F | Ht 66.0 in | Wt 174.0 lb

## 2016-12-23 DIAGNOSIS — I2510 Atherosclerotic heart disease of native coronary artery without angina pectoris: Secondary | ICD-10-CM | POA: Diagnosis not present

## 2016-12-23 DIAGNOSIS — I10 Essential (primary) hypertension: Secondary | ICD-10-CM | POA: Diagnosis not present

## 2016-12-23 DIAGNOSIS — E785 Hyperlipidemia, unspecified: Secondary | ICD-10-CM

## 2016-12-23 DIAGNOSIS — Z0000 Encounter for general adult medical examination without abnormal findings: Secondary | ICD-10-CM

## 2016-12-23 LAB — COMPREHENSIVE METABOLIC PANEL
ALT: 23 U/L (ref 0–53)
AST: 24 U/L (ref 0–37)
Albumin: 4.5 g/dL (ref 3.5–5.2)
Alkaline Phosphatase: 45 U/L (ref 39–117)
BUN: 13 mg/dL (ref 6–23)
CHLORIDE: 100 meq/L (ref 96–112)
CO2: 26 mEq/L (ref 19–32)
CREATININE: 1.01 mg/dL (ref 0.40–1.50)
Calcium: 9.7 mg/dL (ref 8.4–10.5)
GFR: 75.24 mL/min (ref >60.00)
GLUCOSE: 84 mg/dL (ref 70–99)
POTASSIUM: 4.8 meq/L (ref 3.5–5.1)
Sodium: 132 mEq/L — ABNORMAL LOW (ref 135–145)
TOTAL PROTEIN: 7.2 g/dL (ref 6.0–8.3)
Total Bilirubin: 0.4 mg/dL (ref 0.2–1.2)

## 2016-12-23 LAB — LIPID PANEL
CHOLESTEROL: 142 mg/dL (ref 0–200)
HDL: 50 mg/dL (ref >39.00)
LDL Cholesterol: 66 mg/dL (ref 0–99)
NONHDL: 92.23
Total CHOL/HDL Ratio: 3
Triglycerides: 133 mg/dL (ref 0.0–149.0)
VLDL: 26.6 mg/dL (ref 0.0–40.0)

## 2016-12-23 MED ORDER — ROSUVASTATIN CALCIUM 20 MG PO TABS: 20.0000 mg | ORAL_TABLET | Freq: Every day | ORAL | 3 refills | Status: DC

## 2016-12-23 MED ORDER — LOSARTAN POTASSIUM 50 MG PO TABS: 50.0000 mg | ORAL_TABLET | Freq: Every day | ORAL | 3 refills | Status: DC

## 2016-12-23 NOTE — Progress Notes (Signed)
   Subjective:    Patient ID: Spencer Best, male    DOB: 01-26-1933, 83 y.o.   MRN: [id number]  HPI Here for medicare wellness, no new complaints. Please see A/P for status and treatment of chronic medical problems.   HPI #2: coming in for follow up of medical problems including cholesterol (taking crestor daily, no side effects, no chest pains or SOB), and his blood pressure (high today, has not taken meds due to visit, denies SOB, chest pains, headaches, is having a dry cough from the medicine he thinks).   Diet: heart healthy Physical activity: active Depression/mood screen: negative Hearing: intact to whispered voice with bilateral hearing aids Visual acuity: grossly normal with lens, performs annual eye exam  ADLs: capable Fall risk: medium Home safety: good Cognitive evaluation: intact to orientation, naming, recall and repetition, taking namenda and stable EOL planning: adv directives discussed  I have personally reviewed and have noted 1. The patient's medical and social history - reviewed today no changes 2. Their use of alcohol, tobacco or illicit drugs 3. Their current medications and supplements 4. The patient's functional ability including ADL's, fall risks, home safety risks and hearing or visual impairment. 5. Diet and physical activities 6. Evidence for depression or mood disorders 7. Care team reviewed and updated (available in snapshot)  Review of Systems  Constitutional: Negative.   HENT: Negative.   Eyes: Negative.   Respiratory: Negative for cough, chest tightness and shortness of breath.   Cardiovascular: Negative for chest pain, palpitations and leg swelling.  Gastrointestinal: Negative for abdominal distention, abdominal pain, constipation, diarrhea, nausea and vomiting.  Musculoskeletal: Negative.   Skin: Negative.   Neurological: Negative.   Psychiatric/Behavioral: Negative.       Objective:   Physical Exam  Constitutional: He is oriented to  person, place, and time. He appears well-developed and well-nourished.  HENT:  Head: Normocephalic and atraumatic.  Hearing aids bilateral and lens  Eyes: EOM are normal.  Neck: Normal range of motion.  Cardiovascular: Normal rate and regular rhythm.   Pulmonary/Chest: Effort normal and breath sounds normal. No respiratory distress. He has no wheezes. He has no rales.  Abdominal: Soft. Bowel sounds are normal. He exhibits no distension. There is no tenderness. There is no rebound.  Musculoskeletal: He exhibits no edema.  Neurological: He is alert and oriented to person, place, and time. Coordination normal.  Skin: Skin is warm and dry.  Psychiatric: He has a normal mood and affect.   Vitals:   12/23/16 1007  BP: (!) 150/62  Pulse: 61  Temp: 97.8 F (36.6 C)  TempSrc: Oral  SpO2: 95%  Weight: 174 lb (78.9 kg)  Height: 5\' 6"  (1.676 m)      Assessment & Plan:

## 2016-12-23 NOTE — Assessment & Plan Note (Signed)
Aged out of colonoscopy, pneumonia and shingles and tetanus up to date. Flu shot done with season already. Counseled on sun safety and mole surveillance. Given 10 year screening recommendations.

## 2016-12-23 NOTE — Assessment & Plan Note (Signed)
Change BP medication to losartan 50 mg daily to decrease the cough from the ACE-I. Checking CMP.

## 2016-12-23 NOTE — Assessment & Plan Note (Signed)
Checking lipid panel and adjust crestor 20 mg daily as needed.  

## 2016-12-23 NOTE — Patient Instructions (Signed)
We have sent in losartan instead of the other blood pressure medicine. This one should not give you cough. It is still 1 pill per day.   We are checking the labs today and call you with the results.    Health Maintenance, Male A healthy lifestyle and preventive care is important for your health and wellness. Ask your health care provider about what schedule of regular examinations is right for you. What should I know about weight and diet?  Eat a Healthy Diet  Eat plenty of vegetables, fruits, whole grains, low-fat dairy products, and lean protein.  Do not eat a lot of foods high in solid fats, added sugars, or salt. Maintain a Healthy Weight  Regular exercise can help you achieve or maintain a healthy weight. You should:  Do at least 150 minutes of exercise each week. The exercise should increase your heart rate and make you sweat (moderate-intensity exercise).  Do strength-training exercises at least twice a week. Watch Your Levels of Cholesterol and Blood Lipids  Have your blood tested for lipids and cholesterol every 5 years starting at 81 years of age. If you are at high risk for heart disease, you should start having your blood tested when you are 81 years old. You may need to have your cholesterol levels checked more often if:  Your lipid or cholesterol levels are high.  You are older than 81 years of age.  You are at high risk for heart disease. What should I know about cancer screening? Many types of cancers can be detected early and may often be prevented. Lung Cancer  You should be screened every year for lung cancer if:  You are a current smoker who has smoked for at least 30 years.  You are a former smoker who has quit within the past 15 years.  Talk to your health care provider about your screening options, when you should start screening, and how often you should be screened. Colorectal Cancer  Routine colorectal cancer screening usually begins at 81 years of  age and should be repeated every 5-10 years until you are 81 years old. You may need to be screened more often if early forms of precancerous polyps or small growths are found. Your health care provider may recommend screening at an earlier age if you have risk factors for colon cancer.  Your health care provider may recommend using home test kits to check for hidden blood in the stool.  A small camera at the end of a tube can be used to examine your colon (sigmoidoscopy or colonoscopy). This checks for the earliest forms of colorectal cancer. Prostate and Testicular Cancer  Depending on your age and overall health, your health care provider may do certain tests to screen for prostate and testicular cancer.  Talk to your health care provider about any symptoms or concerns you have about testicular or prostate cancer. Skin Cancer  Check your skin from head to toe regularly.  Tell your health care provider about any new moles or changes in moles, especially if:  There is a change in a mole's size, shape, or color.  You have a mole that is larger than a pencil eraser.  Always use sunscreen. Apply sunscreen liberally and repeat throughout the day.  Protect yourself by wearing long sleeves, pants, a wide-brimmed hat, and sunglasses when outside. What should I know about heart disease, diabetes, and high blood pressure?  If you are 13-33 years of age, have your blood pressure checked  every 3-5 years. If you are 60 years of age or older, have your blood pressure checked every year. You should have your blood pressure measured twice-once when you are at a hospital or clinic, and once when you are not at a hospital or clinic. Record the average of the two measurements. To check your blood pressure when you are not at a hospital or clinic, you can use:  An automated blood pressure machine at a pharmacy.  A home blood pressure monitor.  Talk to your health care provider about your target blood  pressure.  If you are between 67-31 years old, ask your health care provider if you should take aspirin to prevent heart disease.  Have regular diabetes screenings by checking your fasting blood sugar level.  If you are at a normal weight and have a low risk for diabetes, have this test once every three years after the age of 57.  If you are overweight and have a high risk for diabetes, consider being tested at a younger age or more often.  A one-time screening for abdominal aortic aneurysm (AAA) by ultrasound is recommended for men aged 52-75 years who are current or former smokers. What should I know about preventing infection? Hepatitis B  If you have a higher risk for hepatitis B, you should be screened for this virus. Talk with your health care provider to find out if you are at risk for hepatitis B infection. Hepatitis C  Blood testing is recommended for:  Everyone born from [birth year] through 1963.  Anyone with known risk factors for hepatitis C. Sexually Transmitted Diseases (STDs)  You should be screened each year for STDs including gonorrhea and chlamydia if:  You are sexually active and are younger than 81 years of age.  You are older than 81 years of age and your health care provider tells you that you are at risk for this type of infection.  Your sexual activity has changed since you were last screened and you are at an increased risk for chlamydia or gonorrhea. Ask your health care provider if you are at risk.  Talk with your health care provider about whether you are at high risk of being infected with HIV. Your health care provider may recommend a prescription medicine to help prevent HIV infection. What else can I do?  Schedule regular health, dental, and eye exams.  Stay current with your vaccines (immunizations).  Do not use any tobacco products, such as cigarettes, chewing tobacco, and e-cigarettes. If you need help quitting, ask your health care provider.  Limit  alcohol intake to no more than 2 drinks per day. One drink equals 12 ounces of beer, 5 ounces of wine, or 1 ounces of hard liquor.  Do not use street drugs.  Do not share needles.  Ask your health care provider for help if you need support or information about quitting drugs.  Tell your health care provider if you often feel depressed.  Tell your health care provider if you have ever been abused or do not feel safe at home. This information is not intended to replace advice given to you by your health care provider. Make sure you discuss any questions you have with your health care provider. Document Released: 04/08/2008 Document Revised: 06/09/2016 Document Reviewed: 07/15/2015 Elsevier Interactive Patient Education  2017 [employer].

## 2016-12-23 NOTE — Progress Notes (Signed)
Pre visit review using our clinic review tool, if applicable. No additional management support is needed unless otherwise documented below in the visit note. 

## 2017-02-14 ENCOUNTER — Telehealth: Payer: Self-pay | Admitting: Neurology

## 2017-02-14 NOTE — Telephone Encounter (Signed)
Caller: Daughter Santiago Glad)  Urgent? No  Reason for the call: Would like to speak with you regarding her dad's driving. Thanks

## 2017-02-18 ENCOUNTER — Encounter: Payer: Self-pay | Admitting: Internal Medicine

## 2017-02-18 ENCOUNTER — Ambulatory Visit (INDEPENDENT_AMBULATORY_CARE_PROVIDER_SITE_OTHER): Payer: Medicare Other | Admitting: Internal Medicine

## 2017-02-18 DIAGNOSIS — R21 Rash and other nonspecific skin eruption: Secondary | ICD-10-CM | POA: Diagnosis not present

## 2017-02-18 DIAGNOSIS — I2510 Atherosclerotic heart disease of native coronary artery without angina pectoris: Secondary | ICD-10-CM | POA: Diagnosis not present

## 2017-02-18 MED ORDER — HYDROXYZINE HCL 10 MG PO TABS: 10.0000 mg | ORAL_TABLET | Freq: Every evening | ORAL | 0 refills | Status: DC | PRN

## 2017-02-18 MED ORDER — PREDNISONE 20 MG PO TABS: 40.0000 mg | ORAL_TABLET | Freq: Every day | ORAL | 0 refills | Status: DC

## 2017-02-18 MED ORDER — TRANDOLAPRIL 2 MG PO TABS: 2.0000 mg | ORAL_TABLET | Freq: Every day | ORAL | 1 refills | Status: DC

## 2017-02-18 NOTE — Assessment & Plan Note (Signed)
Likely drug reaction to losartan and will change back to trandalopril since he was well controlled on this. Rx for prednisone for the rash to help it resolve faster and hydroxyzine for evening to help him with itching at night time so he can sleep.

## 2017-02-18 NOTE — Progress Notes (Signed)
   Subjective:    Patient ID: Spencer Best, male    DOB: 15-Dec-1932, 83 y.o.   MRN: [id number]  HPI The patient is an 83 YO man coming in for rash. He changed to losartan from trandalopril about 1-2 weeks ago. Itching started about 1 week ago and he has been scratching non-stop since that time which has caused rash. On the arms, legs, trunk. Not on the palms or sole. Denies other changes to soaps, detergent, etc. No new allergens. Denies fevers or chills. No weight change. No chest pains, SOB, nausea, vomiting, diarrhea, constipation.   Review of Systems  Constitutional: Negative.   Respiratory: Negative.   Cardiovascular: Negative.   Gastrointestinal: Negative.   Musculoskeletal: Negative.   Skin: Positive for rash. Negative for color change, pallor and wound.  Neurological: Negative.       Objective:   Physical Exam  Constitutional: He is oriented to person, place, and time. He appears well-developed and well-nourished.  HENT:  Head: Normocephalic and atraumatic.  Eyes: EOM are normal.  Neck: Normal range of motion.  Cardiovascular: Normal rate and regular rhythm.   Pulmonary/Chest: Effort normal. No respiratory distress. He has no wheezes.  Abdominal: Soft.  Musculoskeletal: He exhibits no edema.  Neurological: He is alert and oriented to person, place, and time.  Skin: Skin is warm and dry. Rash noted. There is erythema.  Stigmata of scratching on the chest and arms, no macular lesions or blisters   Vitals:   02/18/17 0942  BP: (!) 160/70  Pulse: 64  Resp: 12  Temp: 98.5 F (36.9 C)  TempSrc: Oral  SpO2: 100%  Weight: 175 lb (79.4 kg)  Height: 5\' 6"  (1.676 m)      Assessment & Plan:

## 2017-02-18 NOTE — Patient Instructions (Signed)
We want you to stop taking the losartan.   We have sent in the old blood pressure medicine to start back taking.   We have sent in prednisone to help the reaction stop. Take 2 pills daily for 5 days.  We have sent in hydroxyzine to use at bedtime if you need it for itching.

## 2017-02-18 NOTE — Progress Notes (Signed)
Pre visit review using our clinic review tool, if applicable. No additional management support is needed unless otherwise documented below in the visit note. 

## 2017-03-30 ENCOUNTER — Ambulatory Visit (INDEPENDENT_AMBULATORY_CARE_PROVIDER_SITE_OTHER): Payer: Medicare Other | Admitting: Internal Medicine

## 2017-03-30 ENCOUNTER — Encounter: Payer: Self-pay | Admitting: Internal Medicine

## 2017-03-30 ENCOUNTER — Other Ambulatory Visit (INDEPENDENT_AMBULATORY_CARE_PROVIDER_SITE_OTHER): Payer: Medicare Other

## 2017-03-30 VITALS — BP 140/68 | HR 56 | Temp 97.6°F | Resp 12 | Ht 66.0 in | Wt 175.0 lb

## 2017-03-30 DIAGNOSIS — R21 Rash and other nonspecific skin eruption: Secondary | ICD-10-CM

## 2017-03-30 DIAGNOSIS — I2510 Atherosclerotic heart disease of native coronary artery without angina pectoris: Secondary | ICD-10-CM

## 2017-03-30 DIAGNOSIS — E559 Vitamin D deficiency, unspecified: Secondary | ICD-10-CM

## 2017-03-30 DIAGNOSIS — E538 Deficiency of other specified B group vitamins: Secondary | ICD-10-CM | POA: Diagnosis not present

## 2017-03-30 DIAGNOSIS — R5383 Other fatigue: Secondary | ICD-10-CM

## 2017-03-30 LAB — COMPREHENSIVE METABOLIC PANEL
ALT: 17 U/L (ref 0–53)
AST: 16 U/L (ref 0–37)
Albumin: 4.4 g/dL (ref 3.5–5.2)
Alkaline Phosphatase: 42 U/L (ref 39–117)
BILIRUBIN TOTAL: 0.4 mg/dL (ref 0.2–1.2)
BUN: 15 mg/dL (ref 6–23)
CALCIUM: 9.6 mg/dL (ref 8.4–10.5)
CO2: 30 meq/L (ref 19–32)
CREATININE: 1.01 mg/dL (ref 0.40–1.50)
Chloride: 101 mEq/L (ref 96–112)
GFR: 75.19 mL/min (ref >60.00)
GLUCOSE: 82 mg/dL (ref 70–99)
Potassium: 4.4 mEq/L (ref 3.5–5.1)
SODIUM: 135 meq/L (ref 135–145)
Total Protein: 6.8 g/dL (ref 6.0–8.3)

## 2017-03-30 LAB — VITAMIN B12: Vitamin B-12: 866 pg/mL (ref 211–911)

## 2017-03-30 LAB — CBC
HCT: 38.7 % — ABNORMAL LOW (ref 39.0–52.0)
Hemoglobin: 13.3 g/dL (ref 13.0–17.0)
MCHC: 34.3 g/dL (ref 30.0–36.0)
MCV: 90.8 fl (ref 78.0–100.0)
PLATELETS: 162 10*3/uL (ref 150.0–400.0)
RBC: 4.26 Mil/uL (ref 4.22–5.81)
RDW: 14 % (ref 11.5–15.5)
WBC: 6 10*3/uL (ref 4.0–10.5)

## 2017-03-30 LAB — TSH: TSH: 4.39 u[IU]/mL (ref 0.35–4.50)

## 2017-03-30 LAB — VITAMIN D 25 HYDROXY (VIT D DEFICIENCY, FRACTURES): VITD: 26.3 ng/mL — ABNORMAL LOW (ref 30.00–100.00)

## 2017-03-30 LAB — T4, FREE: Free T4: 0.66 ng/dL (ref 0.60–1.60)

## 2017-03-30 MED ORDER — TRIAMCINOLONE ACETONIDE 0.1 % EX CREA: 1.0000 "application " | TOPICAL_CREAM | Freq: Two times a day (BID) | CUTANEOUS | 0 refills | Status: AC

## 2017-03-30 NOTE — Patient Instructions (Signed)
We have sent in a cream to use on the itching areas to help relieve that.   We are checking the labs to see if there is a cause.   We would like you to stop namenda for 1 week to see if the itching improves.   We would like you to stop the crestor for 2 weeks to see if the itching and weakness changes.   Call us to let us know.

## 2017-03-31 ENCOUNTER — Ambulatory Visit: Payer: Medicare Other | Admitting: Internal Medicine

## 2017-03-31 ENCOUNTER — Other Ambulatory Visit: Payer: Self-pay | Admitting: Internal Medicine

## 2017-03-31 DIAGNOSIS — L299 Pruritus, unspecified: Secondary | ICD-10-CM

## 2017-04-01 NOTE — Progress Notes (Signed)
   Subjective:    Patient ID: Spencer Best, male    DOB: Sep 09, 1933, 83 y.o.   MRN: [id number]  HPI The patient is an 83 YO man coming in for itching. He is scratching which causes rash. It is on his chest, arms, legs, back. He denies change in household products, new clothes. He does have some allergies and works outside but not just on exposed skin. He was seen for this back about 2 months ago and given prednisone burst with hydroxyzine for itching. He has not started claritin BID as advised but is taking daily which he has not noticed benefit. No fevers or chills. No weight change. No other symptoms. No change to bowels or bladder. No chest pains or SOB. No change to medications or doses in the last 3-5 months. He is not sure if the prednisone helped with itching but he does not remember it working well.   Review of Systems  Constitutional: Negative.   Respiratory: Negative.   Cardiovascular: Negative.   Gastrointestinal: Negative.   Musculoskeletal: Negative.   Skin: Positive for color change and rash. Negative for pallor and wound.       itching  Neurological: Negative.       Objective:   Physical Exam  Constitutional: He appears well-developed and well-nourished.  HENT:  Head: Normocephalic and atraumatic.  Eyes: EOM are normal.  Cardiovascular: Normal rate and regular rhythm.   Pulmonary/Chest: Effort normal.  Abdominal: Soft.  Skin: Skin is warm and dry.  Rash on the arms and legs with stigmata of scratching although able to control scratching during the visit.    Vitals:   03/30/17 1020  BP: 140/68  Pulse: (!) 56  Resp: 12  Temp: 97.6 F (36.4 C)  TempSrc: Oral  SpO2: 99%  Weight: 175 lb (79.4 kg)  Height: 5\' 6"  (1.676 m)       Assessment & Plan:

## 2017-04-01 NOTE — Assessment & Plan Note (Signed)
Checking labs to rule out metabolic cause since he is still having the same problem. Rx for triamcinolone cream and reminded about the importance of not scratching as this worsens the problem. Referral to allergy to assess if there is an environmental trigger. Advised to stop meds in rotation fashion for 2 weeks each to see if they are the culprit although not changed recently.

## 2017-04-11 DIAGNOSIS — H40012 Open angle with borderline findings, low risk, left eye: Secondary | ICD-10-CM | POA: Diagnosis not present

## 2017-04-11 DIAGNOSIS — H2513 Age-related nuclear cataract, bilateral: Secondary | ICD-10-CM | POA: Diagnosis not present

## 2017-04-26 DIAGNOSIS — S80861A Insect bite (nonvenomous), right lower leg, initial encounter: Secondary | ICD-10-CM | POA: Diagnosis not present

## 2017-07-23 ENCOUNTER — Other Ambulatory Visit: Payer: Self-pay | Admitting: Neurology

## 2017-07-29 DIAGNOSIS — X32XXXD Exposure to sunlight, subsequent encounter: Secondary | ICD-10-CM | POA: Diagnosis not present

## 2017-07-29 DIAGNOSIS — D225 Melanocytic nevi of trunk: Secondary | ICD-10-CM | POA: Diagnosis not present

## 2017-07-29 DIAGNOSIS — L821 Other seborrheic keratosis: Secondary | ICD-10-CM | POA: Diagnosis not present

## 2017-07-29 DIAGNOSIS — L570 Actinic keratosis: Secondary | ICD-10-CM | POA: Diagnosis not present

## 2017-08-03 DIAGNOSIS — J209 Acute bronchitis, unspecified: Secondary | ICD-10-CM | POA: Diagnosis not present

## 2017-08-08 ENCOUNTER — Telehealth: Payer: Self-pay | Admitting: Neurology

## 2017-08-08 NOTE — Telephone Encounter (Signed)
Patient's wife called needing to see if there is anything that can be given to her husband to help him rest. She said several days ago it started where he is up throughout the night. Please Advise. Thank you

## 2017-08-08 NOTE — Telephone Encounter (Signed)
Please advise 

## 2017-08-08 NOTE — Telephone Encounter (Signed)
Start OTC melatonin 1-3mg  3 hours before bedtime for sleep.

## 2017-08-08 NOTE — Telephone Encounter (Signed)
Patient's wife given the instructions and agreed with plan.

## 2017-08-10 DIAGNOSIS — Z23 Encounter for immunization: Secondary | ICD-10-CM | POA: Diagnosis not present

## 2017-09-06 ENCOUNTER — Other Ambulatory Visit: Payer: Self-pay | Admitting: Internal Medicine

## 2017-11-30 ENCOUNTER — Encounter: Payer: Self-pay | Admitting: Cardiology

## 2017-12-05 NOTE — Progress Notes (Signed)
Cardiology Office Note   Date:  12/06/2017   ID:  Spencer Best, DOB [birth year], MRN [id number]  PCP:  Hoyt Koch, MD  Cardiologist:   Minus Breeding, MD    Chief Complaint  Patient presents with  . Coronary Artery Disease    History of Present Illness: Spencer Best is a 84 y.o. male who presents for follow up of CAD with bypass surgery in 1994. In January 2016 he had a syncopal episode and was in the emergency room. No etiology was identified. He subsequently had a stress perfusion study which demonstrated a fixed inferior and apical defect with an EF of 53% which was felt to be low risk. He did wear a monitor which demonstrated no significant arrhythmias.   He returns for one year follow up.   Since I last saw him he has done well.  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  The patient denies any new symptoms such as chest discomfort, neck or arm discomfort. There has been no new shortness of breath, PND or orthopnea. There have been no reported palpitations, presyncope or syncope.   Past Medical History:  Diagnosis Date  . HEMORRHOIDS, HX OF   . HYPERLIPIDEMIA   . HYPERTENSION   . LABRYNTHITIS   . MYOCARDIAL INFARCTION, INFERIOR WALL, SUBSEQUENT CARE 06/11/2009  . PERCUTANEOUS TRANSLUMINAL CORONARY ANGIOPLASTY, HX OF 08/16/2007  . SYNCOPE, VASOVAGAL     Past Surgical History:  Procedure Laterality Date  . CORONARY ARTERY BYPASS GRAFT     L internal mammary artery to L anterior, saphenous vein graft [Other]  . correction of abdominal birth defect     Age 74     Current Outpatient Medications  Medication Sig Dispense Refill  . aspirin 81 MG tablet Take 81 mg by mouth daily.    . cyanocobalamin 1000 MCG tablet Take 1,000 mcg by mouth daily.    . Melatonin 3 MG CAPS Take 1 capsule by mouth at bedtime.    . memantine (NAMENDA) 10 MG tablet TAKE ONE TABLET BY MOUTH TWICE DAILY 180 tablet 3  . nitroGLYCERIN (NITROSTAT) 0.4 MG  SL tablet Place 1 tablet (0.4 mg total) under the tongue every 5 (five) minutes as needed for chest pain. 25 tablet 3  . Omega-3 Fatty Acids (FISH OIL) 1200 MG CAPS Take 1 capsule by mouth daily.      . rosuvastatin (CRESTOR) 20 MG tablet Take 1 tablet (20 mg total) by mouth daily. 90 tablet 3  . triamcinolone cream (KENALOG) 0.1 % Apply 1 application topically 2 (two) times daily. 453.6 g 0  . losartan (COZAAR) 50 MG tablet Take 1 tablet (50 mg total) by mouth daily. 90 tablet 3   No current facility-administered medications for this visit.     Allergies:   Patient has no known allergies.    ROS:  Please see the history of present illness.   Otherwise, review of systems are positive for dry cough.   All other systems are reviewed and negative.    PHYSICAL EXAM: VS:  BP 130/62 (BP Location: Left Arm, Patient Position: Sitting, Cuff Size: Normal)   Pulse 62   Ht 5\' 6"  (1.676 m)   Wt 184 lb (83.5 kg)   BMI 29.70 kg/m  , BMI Body mass index is 29.7 kg/m.  GENERAL:  Well appearing NECK:  No jugular venous distention, waveform within normal limits, carotid upstroke brisk and symmetric, no  bruits, no thyromegaly LUNGS:  Clear to auscultation bilaterally CHEST:  Well healed sternotomy scar. HEART:  PMI not displaced or sustained,S1 and S2 within normal limits, no S3, no S4, no clicks, no rubs, no murmurs ABD:  Flat, positive bowel sounds normal in frequency in pitch, no bruits, no rebound, no guarding, no midline pulsatile mass, no hepatomegaly, no splenomegaly, abdominal scar EXT:  2 plus pulses throughout, no edema, no cyanosis no clubbing   EKG:  EKG is  ordered today. The ekg ordered today demonstrates Sinus rhythm, rate 62, axis within normal limits, intervals within normal limits, poor anterior R wave progression, no acute ST-T wave changes area   Recent Labs: 03/30/2017: ALT 17; BUN 15; Creatinine, Ser 1.01; Hemoglobin 13.3; Platelets 162.0; Potassium 4.4; Sodium 135; TSH 4.39     Lipid Panel    Component Value Date/Time   CHOL 142 12/23/2016 1055   TRIG 133.0 12/23/2016 1055   HDL 50.00 12/23/2016 1055   CHOLHDL 3 12/23/2016 1055   VLDL 26.6 12/23/2016 1055   LDLCALC 66 12/23/2016 1055      Wt Readings from Last 3 Encounters:  12/06/17 184 lb (83.5 kg)  03/30/17 175 lb (79.4 kg)  02/18/17 175 lb (79.4 kg)      Other studies Reviewed: Additional studies/ records that were reviewed today include: None. Review of the above records demonstrates:     ASSESSMENT AND PLAN:  CAD:   The patient has no new sypmtoms.  No further cardiovascular testing is indicated.  We will continue with aggressive risk reduction and meds as listed.  I encouraged him to find some other form of exercise now that the cows are gone.  HYPERLIPIDEMIA:   His lipids last March (and by his wife's report this year at the [place]) were excellent and he will continue current meds.   HTN:  The blood pressure is at target.  However, he has cough.  His primary provider did give him some Cozaar which she took instead of the [facility].  He did not think it helped his cough but he did not take it for very long and I have encouraged him to try that again.  If his cough still persists on Cozaar I might suggest to his primary provider considering reflux or sinus issues.    Current medicines are reviewed at length with the patient today.  The patient does not have concerns regarding medicines.  The following changes have been made:  As above  Labs/ tests ordered today include: None  Orders Placed This Encounter  Procedures  . EKG 12-Lead     Disposition:   FU with me in one year.     Signed, Minus Breeding, MD  12/06/2017 1:30 PM    [place]

## 2017-12-06 ENCOUNTER — Ambulatory Visit: Payer: Medicare HMO | Admitting: Cardiology

## 2017-12-06 ENCOUNTER — Encounter: Payer: Self-pay | Admitting: Cardiology

## 2017-12-06 VITALS — BP 130/62 | HR 62 | Ht 66.0 in | Wt 184.0 lb

## 2017-12-06 DIAGNOSIS — E785 Hyperlipidemia, unspecified: Secondary | ICD-10-CM

## 2017-12-06 DIAGNOSIS — I2510 Atherosclerotic heart disease of native coronary artery without angina pectoris: Secondary | ICD-10-CM | POA: Diagnosis not present

## 2017-12-06 DIAGNOSIS — I10 Essential (primary) hypertension: Secondary | ICD-10-CM | POA: Diagnosis not present

## 2017-12-06 MED ORDER — LOSARTAN POTASSIUM 50 MG PO TABS: 50.0000 mg | ORAL_TABLET | Freq: Every day | ORAL | 3 refills | Status: DC

## 2017-12-06 NOTE — Patient Instructions (Signed)
Medication Instructions:  STOP-Trandolapril START- Losartan 50 mg daily  If you need a refill on your cardiac medications before your next appointment, please call your pharmacy.  Labwork: None Ordered   Testing/Procedures: None Ordered   Follow-Up: Your physician wants you to follow-up in: 1 Year. You should receive a reminder letter in the mail two months in advance. If you do not receive a letter, please call our office [phone].   Thank you for choosing [facility] at [facility]!!

## 2017-12-23 ENCOUNTER — Encounter: Payer: Self-pay | Admitting: Neurology

## 2017-12-23 ENCOUNTER — Ambulatory Visit: Payer: Medicare HMO | Admitting: Neurology

## 2017-12-23 VITALS — BP 140/64 | HR 58 | Ht 66.0 in | Wt 183.1 lb

## 2017-12-23 DIAGNOSIS — R292 Abnormal reflex: Secondary | ICD-10-CM | POA: Diagnosis not present

## 2017-12-23 DIAGNOSIS — G301 Alzheimer's disease with late onset: Secondary | ICD-10-CM

## 2017-12-23 DIAGNOSIS — F0280 Dementia in other diseases classified elsewhere without behavioral disturbance: Secondary | ICD-10-CM | POA: Diagnosis not present

## 2017-12-23 DIAGNOSIS — R69 Illness, unspecified: Secondary | ICD-10-CM | POA: Diagnosis not present

## 2017-12-23 NOTE — Patient Instructions (Signed)
Return to clinic in 1 year.

## 2017-12-23 NOTE — Progress Notes (Signed)
Follow-up Visit   Date: 12/23/17    [name] MRN: [id number] DOB: May 30, 1933   Interim History: Spencer Best is a 84 y.o. right-handed Caucasian male with CAD, hyperlipidemia, hypertension, and vasovagal syncope returning to the clinic for follow-up of Alzheimer's disease.  The patient was accompanied to the clinic by wife who also provides collateral information.    History of present illness: Late 2016, wife noticed that he would frequently ask her about doctor appointments, misplaces objects, and frequently forgets the date. He has problems with short-term memory moreso than long-term.  He continues to drive and has not been involved in any MVAs.  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  He usually always travels with his wife and she had does not endorse any safety issues with driving.  He manages household finances, but his wife does oversee it.  He does is own ADLs and walks independently.    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  He reports forgetting to chores on his farms.  He continues to be very active on the farm - moves the cattle every 2-3 days, mowing, places dividers in the farm, goes out to eat.    UPDATE 06/23/2016:  His neuropsychological testing confirming Alzheimer's dementia (mild).  He has no new complaints or changes with his memory since he was last here in June.  Wife has noticed that he often forgets to pay the bills and often has to remind him of this.  She does not have any issues with driving safety and reports that he can get lost sometimes, but they always try to travel together. Overall, he is still able to do all his IADLS and ADLs.      UPDATE 12/20/2016:  He is here for 94-month appointment.  He denies any new neurological complaints.  Wife feels that his memory deficits are stable.  He is still active on the farm and  she allows him to drive locally to familiar places, but anything long distance, she always accompanies him.  He has not got lost lately.  They manages the finances together now.  She does not have any home safety issues. He can be off balance sometimes and she encouraged him to use a cane, but is noncompliant. Fortunately, he has not had any interval falls. Mood and sleep is good.  They sold some of their cattle and now have 20 cows which they manage together. Wife states that they are trying to limit the amount of farm work, but keeping the cattle allows them to claim farmland on their 80+ acres.   UPDATE 12/23/2017:  He is here with his wife for 1 year follow-up visit.  He has been relatively stable with respect to his health.  Wife states that he tends to repeat the same questions often, such as what the plans for the next day are.  She denies any changes in his behavior or getting lost. He continues to drive local distances only and wife feels that he is still safe doing this; he does not drive alone.  Wife manages the finances, but they discuss financial decision together. Wife manages his medication in a pill box, and he usually remembers to take it himself.  They stay very active and go to [employer] about twice per week.  Medications:  Current Outpatient Medications on File Prior to Visit  Medication Sig Dispense Refill  . aspirin 81 MG tablet Take 81 mg by mouth daily.    . cyanocobalamin 1000 MCG tablet Take 1,000 mcg by mouth daily.    Marland Kitchen losartan (COZAAR) 50 MG tablet Take 1 tablet (50 mg total) by mouth daily. 90 tablet 3  . Melatonin 3 MG CAPS Take 1 capsule by mouth at bedtime.    . memantine (NAMENDA) 10 MG tablet TAKE ONE TABLET BY MOUTH TWICE DAILY 180 tablet 3  . nitroGLYCERIN (NITROSTAT) 0.4 MG SL tablet Place 1 tablet (0.4 mg total) under the tongue every 5 (five) minutes as needed for chest pain. 25 tablet 3  . Omega-3 Fatty Acids (FISH OIL) 1200 MG CAPS Take 1 capsule by  mouth daily.      . rosuvastatin (CRESTOR) 20 MG tablet Take 1 tablet (20 mg total) by mouth daily. 90 tablet 3  . triamcinolone cream (KENALOG) 0.1 % Apply 1 application topically 2 (two) times daily. 453.6 g 0   No current facility-administered medications on file prior to visit.     Allergies: No Known Allergies  Review of Systems:  CONSTITUTIONAL: No fevers, chills, night sweats, or weight loss.  EYES: No visual changes or eye pain ENT: No hearing changes.  No history of nose bleeds.   RESPIRATORY: No cough, wheezing and shortness of breath.   CARDIOVASCULAR: Negative for chest pain, and palpitations.   GI: Negative for abdominal discomfort, blood in stools or black stools.  No recent change in bowel habits.   GU:  No history of incontinence.   MUSCLOSKELETAL: No history of joint pain or swelling.  No myalgias.   SKIN: Negative for lesions, rash, and itching.   ENDOCRINE: Negative for cold or heat intolerance, polydipsia or goiter.   PSYCH:  No depression or anxiety symptoms.   NEURO: As Above.   Vital Signs:  BP 140/64   Pulse (!) 58   Ht 5\' 6"  (1.676 m)   Wt 183 lb 2 oz (83.1 kg)   SpO2 91%   BMI 29.56 kg/m  Pain Scale: 0 on a scale of 0-10   Neurological Exam: MENTAL STATUS including orientation to time, place, person, recent and remote memory, attention span and concentration, language, and fund of knowledge is fairly intact.  Blunted affect and reduce spontaneous speech.  CRANIAL NERVES: Pupils equal round and reactive to light.  Normal conjugate, extra-ocular eye movements in all directions of gaze.  Right ptosis (old).  Face is symmetric.  MOTOR:  Motor strength is 5/5 in all extremities.   No pronator drift.  Tone is slightly increased in the legs.    MSRs:  Reflexes are 2+/4 throughout, except 3+ bilateral patella jerks.    COORDINATION/GAIT:   Gait slightly wide-based and stable, mild unsteadiness with turning  Data: Lab Results  Component Value Date    TSH 4.39 03/30/2017   Lab Results  Component Value Date   VITAMINB12 866 03/30/2017   MRI brain wo contrast 04/13/2016: No acute infarct. Tiny areas of blood breakdown products greater in the frontal lobes suggestive of result of prior hemorrhagic ischemia. Remote right frontal lobe infarct. Moderate chronic microvascular changes. Thin-section imaging through the internal auditory canal was not performed and contrast not administered. Taking this limitation into account, no obvious internal auditory canal lesion is identified. Mild global atrophy without hydrocephalus.   IMPRESSION/PLAN: 1.  Alzheimer's dementia without behavior change, mild - moderate.  He  has remained relatively stable over the past year and continues to drive locally under his wife's supervision.  There are no home safety issues.  I will continue him on namenda 10mg  BID.  He is not a candidate for acetylcholinesterase inhibitor therapy due to bradycardia.  Again, I stressed the importance of self-restricting driving, if there are any concerns regarding driving safety.  Going forward, I may consider getting driving evaluation, but at this time wife feels safe with him behind the wheel.  I praised him for staying active physically and engaging in word searches.    2.  Hyperreflexia likely due to lumbar canal stenosis, asymptomatic. Follow clinically.  Encouraged to use a cane.  Return to clinic in 1 year  Greater than 50% of this 20 minute visit was spent in counseling, explanation of diagnosis, planning of further management, and coordination of care.   Thank you for allowing me to participate in patient's care.  If I can answer any additional questions, I would be pleased to do so.    Sincerely,    Tavion Senkbeil K. Posey Pronto, DO

## 2017-12-26 ENCOUNTER — Encounter: Payer: Self-pay | Admitting: Internal Medicine

## 2017-12-26 ENCOUNTER — Ambulatory Visit (INDEPENDENT_AMBULATORY_CARE_PROVIDER_SITE_OTHER): Payer: Medicare HMO | Admitting: Internal Medicine

## 2017-12-26 ENCOUNTER — Ambulatory Visit: Payer: Medicare HMO

## 2017-12-26 ENCOUNTER — Other Ambulatory Visit (INDEPENDENT_AMBULATORY_CARE_PROVIDER_SITE_OTHER): Payer: Medicare HMO

## 2017-12-26 VITALS — BP 130/82 | HR 59 | Temp 98.0°F | Ht 66.0 in | Wt 183.0 lb

## 2017-12-26 DIAGNOSIS — E785 Hyperlipidemia, unspecified: Secondary | ICD-10-CM

## 2017-12-26 DIAGNOSIS — Z0000 Encounter for general adult medical examination without abnormal findings: Secondary | ICD-10-CM | POA: Diagnosis not present

## 2017-12-26 DIAGNOSIS — I10 Essential (primary) hypertension: Secondary | ICD-10-CM

## 2017-12-26 LAB — COMPREHENSIVE METABOLIC PANEL
ALT: 15 U/L (ref 0–53)
AST: 16 U/L (ref 0–37)
Albumin: 4.2 g/dL (ref 3.5–5.2)
Alkaline Phosphatase: 48 U/L (ref 39–117)
BILIRUBIN TOTAL: 0.5 mg/dL (ref 0.2–1.2)
BUN: 12 mg/dL (ref 6–23)
CALCIUM: 9.7 mg/dL (ref 8.4–10.5)
CO2: 28 meq/L (ref 19–32)
CREATININE: 1.01 mg/dL (ref 0.40–1.50)
Chloride: 101 mEq/L (ref 96–112)
GFR: 75.05 mL/min (ref >60.00)
Glucose, Bld: 96 mg/dL (ref 70–99)
Potassium: 4.2 mEq/L (ref 3.5–5.1)
Sodium: 136 mEq/L (ref 135–145)
TOTAL PROTEIN: 7.3 g/dL (ref 6.0–8.3)

## 2017-12-26 LAB — LIPID PANEL
CHOL/HDL RATIO: 3
CHOLESTEROL: 121 mg/dL (ref 0–200)
HDL: 44.9 mg/dL (ref >39.00)
LDL Cholesterol: 47 mg/dL (ref 0–99)
NonHDL: 76.22
TRIGLYCERIDES: 146 mg/dL (ref 0.0–149.0)
VLDL: 29.2 mg/dL (ref 0.0–40.0)

## 2017-12-26 LAB — CBC
HCT: 40.9 % (ref 39.0–52.0)
Hemoglobin: 14.2 g/dL (ref 13.0–17.0)
MCHC: 34.8 g/dL (ref 30.0–36.0)
MCV: 91 fl (ref 78.0–100.0)
PLATELETS: 175 10*3/uL (ref 150.0–400.0)
RBC: 4.5 Mil/uL (ref 4.22–5.81)
RDW: 13.7 % (ref 11.5–15.5)
WBC: 7 10*3/uL (ref 4.0–10.5)

## 2017-12-26 NOTE — Assessment & Plan Note (Signed)
Checking lipid panel and adjust crestor 20 mg daily as needed for LDL goal <70. 

## 2017-12-26 NOTE — Patient Instructions (Signed)
We are checking the labs today.   Health Maintenance, Male A healthy lifestyle and preventive care is important for your health and wellness. Ask your health care provider about what schedule of regular examinations is right for you. What should I know about weight and diet? Eat a Healthy Diet  Eat plenty of vegetables, fruits, whole grains, low-fat dairy products, and lean protein.  Do not eat a lot of foods high in solid fats, added sugars, or salt.  Maintain a Healthy Weight Regular exercise can help you achieve or maintain a healthy weight. You should:  Do at least 150 minutes of exercise each week. The exercise should increase your heart rate and make you sweat (moderate-intensity exercise).  Do strength-training exercises at least twice a week.  Watch Your Levels of Cholesterol and Blood Lipids  Have your blood tested for lipids and cholesterol every 5 years starting at 82 years of age. If you are at high risk for heart disease, you should start having your blood tested when you are 82 years old. You may need to have your cholesterol levels checked more often if: ? Your lipid or cholesterol levels are high. ? You are older than 82 years of age. ? You are at high risk for heart disease.  What should I know about cancer screening? Many types of cancers can be detected early and may often be prevented. Lung Cancer  You should be screened every year for lung cancer if: ? You are a current smoker who has smoked for at least 30 years. ? You are a former smoker who has quit within the past 15 years.  Talk to your health care provider about your screening options, when you should start screening, and how often you should be screened.  Colorectal Cancer  Routine colorectal cancer screening usually begins at 82 years of age and should be repeated every 5-10 years until you are 82 years old. You may need to be screened more often if early forms of precancerous polyps or small growths  are found. Your health care provider may recommend screening at an earlier age if you have risk factors for colon cancer.  Your health care provider may recommend using home test kits to check for hidden blood in the stool.  A small camera at the end of a tube can be used to examine your colon (sigmoidoscopy or colonoscopy). This checks for the earliest forms of colorectal cancer.  Prostate and Testicular Cancer  Depending on your age and overall health, your health care provider may do certain tests to screen for prostate and testicular cancer.  Talk to your health care provider about any symptoms or concerns you have about testicular or prostate cancer.  Skin Cancer  Check your skin from head to toe regularly.  Tell your health care provider about any new moles or changes in moles, especially if: ? There is a change in a mole's size, shape, or color. ? You have a mole that is larger than a pencil eraser.  Always use sunscreen. Apply sunscreen liberally and repeat throughout the day.  Protect yourself by wearing long sleeves, pants, a wide-brimmed hat, and sunglasses when outside.  What should I know about heart disease, diabetes, and high blood pressure?  If you are 17-76 years of age, have your blood pressure checked every 3-5 years. If you are 69 years of age or older, have your blood pressure checked every year. You should have your blood pressure measured twice-once when you are  at a hospital or clinic, and once when you are not at a hospital or clinic. Record the average of the two measurements. To check your blood pressure when you are not at a hospital or clinic, you can use: ? An automated blood pressure machine at a pharmacy. ? A home blood pressure monitor.  Talk to your health care provider about your target blood pressure.  If you are between 45-79 years old, ask your health care provider if you should take aspirin to prevent heart disease.  Have regular diabetes  screenings by checking your fasting blood sugar level. ? If you are at a normal weight and have a low risk for diabetes, have this test once every three years after the age of 45. ? If you are overweight and have a high risk for diabetes, consider being tested at a younger age or more often.  A one-time screening for abdominal aortic aneurysm (AAA) by ultrasound is recommended for men aged 65-75 years who are current or former smokers. What should I know about preventing infection? Hepatitis B If you have a higher risk for hepatitis B, you should be screened for this virus. Talk with your health care provider to find out if you are at risk for hepatitis B infection. Hepatitis C Blood testing is recommended for:  Everyone born from 1945 through 1965.  Anyone with known risk factors for hepatitis C.  Sexually Transmitted Diseases (STDs)  You should be screened each year for STDs including gonorrhea and chlamydia if: ? You are sexually active and are younger than 82 years of age. ? You are older than 82 years of age and your health care provider tells you that you are at risk for this type of infection. ? Your sexual activity has changed since you were last screened and you are at an increased risk for chlamydia or gonorrhea. Ask your health care provider if you are at risk.  Talk with your health care provider about whether you are at high risk of being infected with HIV. Your health care provider may recommend a prescription medicine to help prevent HIV infection.  What else can I do?  Schedule regular health, dental, and eye exams.  Stay current with your vaccines (immunizations).  Do not use any tobacco products, such as cigarettes, chewing tobacco, and e-cigarettes. If you need help quitting, ask your health care provider.  Limit alcohol intake to no more than 2 drinks per day. One drink equals 12 ounces of beer, 5 ounces of wine, or 1 ounces of hard liquor.  Do not use street  drugs.  Do not share needles.  Ask your health care provider for help if you need support or information about quitting drugs.  Tell your health care provider if you often feel depressed.  Tell your health care provider if you have ever been abused or do not feel safe at home. This information is not intended to replace advice given to you by your health care provider. Make sure you discuss any questions you have with your health care provider. Document Released: 04/08/2008 Document Revised: 06/09/2016 Document Reviewed: 07/15/2015 Elsevier Interactive Patient Education  2018 Elsevier Inc[employer]  

## 2017-12-26 NOTE — Assessment & Plan Note (Signed)
Aged out of colonoscopy, flu and tetanus and pneumonia up to date. Counseled about sun safety and mole surveillance. Given 10 year screening recommendations. Counseled about shingrix.

## 2017-12-26 NOTE — Progress Notes (Signed)
   Subjective:    Patient ID: Spencer Best, male    DOB: Mar 15, 1933, 84 y.o.   MRN: [id number]  HPI The patient is an 84 YO man coming in for physical. Memory is stable. Still driving with supervision. Taking namenda without side effects.   PMH, [facility], social history reviewed and updated  Review of Systems  Constitutional: Negative.   HENT: Negative.   Eyes: Negative.   Respiratory: Negative for cough, chest tightness and shortness of breath.   Cardiovascular: Negative for chest pain, palpitations and leg swelling.  Gastrointestinal: Negative for abdominal distention, abdominal pain, constipation, diarrhea, nausea and vomiting.  Musculoskeletal: Negative.   Skin: Negative.   Neurological: Negative.   Psychiatric/Behavioral: Negative.       Objective:   Physical Exam  Constitutional: He is oriented to person, place, and time. He appears well-developed and well-nourished.  HENT:  Head: Normocephalic and atraumatic.  Eyes: EOM are normal.  Neck: Normal range of motion.  Cardiovascular: Normal rate and regular rhythm.  Pulmonary/Chest: Effort normal and breath sounds normal. No respiratory distress. He has no wheezes. He has no rales.  Abdominal: Soft. Bowel sounds are normal. He exhibits no distension. There is no tenderness. There is no rebound.  Musculoskeletal: He exhibits no edema.  Neurological: He is alert and oriented to person, place, and time. Coordination normal.  Skin: Skin is warm and dry.  Psychiatric: He has a normal mood and affect.   Vitals:   12/26/17 0928  BP: 130/82  Pulse: (!) 59  Temp: 98 F (36.7 C)  TempSrc: Oral  SpO2: 96%  Weight: 183 lb (83 kg)  Height: 5\' 6"  (1.676 m)      Assessment & Plan:

## 2017-12-26 NOTE — Assessment & Plan Note (Signed)
Taking losartan 50 mg daily, adjust as needed. Checking CMP and adjust as needed.

## 2018-01-02 ENCOUNTER — Other Ambulatory Visit: Payer: Self-pay | Admitting: Internal Medicine

## 2018-01-06 ENCOUNTER — Ambulatory Visit: Payer: Medicare HMO

## 2018-01-06 DIAGNOSIS — H40012 Open angle with borderline findings, low risk, left eye: Secondary | ICD-10-CM | POA: Diagnosis not present

## 2018-01-09 NOTE — Progress Notes (Signed)
Subjective:   Spencer Best is a 84 y.o. male who presents for Medicare Annual/Subsequent preventive examination.  Review of Systems:  No ROS.  Medicare Wellness Visit. Additional risk factors are reflected in the social history.  Cardiac Risk Factors include: advanced age (>30men, >50 women);dyslipidemia;male gender;hypertension Sleep patterns: feels rested on waking, gets up 1-2 times nightly to void and sleeps 7-8 hours nightly.    Home Safety/Smoke Alarms: Feels safe in home. Smoke alarms in place.  Living environment; residence and Firearm Safety: 1-story house/ trailer, no firearms. Lives with wife, no needs for DME, good support system Seat Belt Safety/Bike Helmet: Wears seat belt.   PSA-  Lab Results  Component Value Date   PSA 1.05 09/23/2008   PSA 1.21 09/22/2007       Objective:    Vitals: BP (!) 148/62   Pulse (!) 57   Resp 18   Ht 5\' 6"  (1.676 m)   Wt 181 lb (82.1 kg)   SpO2 98%   BMI 29.21 kg/m   Body mass index is 29.21 kg/m.  Advanced Directives 01/10/2018  Does Patient Have a Medical Advance Directive? No  Would patient like information on creating a medical advance directive? No - Patient declined    Tobacco Social History   Tobacco Use  Smoking Status Former Smoker  . Types: Cigarettes  . Last attempt to quit: 06/25/1971  . Years since quitting: 46.5  Smokeless Tobacco Never Used     Counseling given: Not Answered  Past Medical History:  Diagnosis Date  . HEMORRHOIDS, HX OF   . HYPERLIPIDEMIA   . HYPERTENSION   . LABRYNTHITIS   . MYOCARDIAL INFARCTION, INFERIOR WALL, SUBSEQUENT CARE 06/11/2009  . PERCUTANEOUS TRANSLUMINAL CORONARY ANGIOPLASTY, HX OF 08/16/2007  . SYNCOPE, VASOVAGAL    Past Surgical History:  Procedure Laterality Date  . CORONARY ARTERY BYPASS GRAFT     L internal mammary artery to L anterior, saphenous vein graft [Other]  . correction of abdominal birth defect     Age 18   Family History  Problem Relation Age of  Onset  . Coronary artery disease Father   . Osteoarthritis Father   . Parkinsonism Father   . Rheum arthritis Father   . Diverticulitis Mother   . Arrhythmia Mother   . Leukemia Mother   . Lung cancer Brother   . Healthy Son   . Healthy Daughter   . Colon cancer Neg Hx   . Prostate cancer Neg Hx    Social History   Socioeconomic History  . Marital status: Married    Spouse name: None  . Number of children: 2  . Years of education: 59  . Highest education level: None  Social Needs  . Financial resource strain: Not hard at all  . Food insecurity - worry: Never true  . Food insecurity - inability: Never true  . Transportation needs - medical: No  . Transportation needs - non-medical: No  Occupational History  . Occupation: Full time on farm  . Occupation: RETIRED    Employer: RETIRED  Tobacco Use  . Smoking status: Former Smoker    Types: Cigarettes    Last attempt to quit: 06/25/1971    Years since quitting: 46.5  . Smokeless tobacco: Never Used  Substance and Sexual Activity  . Alcohol use: No    Alcohol/week: 0.0 oz  . Drug use: No  . Sexual activity: No  Other Topics Concern  . None  Social History Narrative  HSG Married 1955. 1 son 21, 1 daughter 32, 3 grandchildren.    Work full time on the farm, Manufacturing engineer. Marriage in good health. ACP - discussed with him and his wife. Referred to [email or web address].  Lives in a one story home.     Education: high school.    Outpatient Encounter Medications as of 01/10/2018  Medication Sig  . aspirin 81 MG tablet Take 81 mg by mouth daily.  . cyanocobalamin 1000 MCG tablet Take 1,000 mcg by mouth daily.  Marland Kitchen losartan (COZAAR) 50 MG tablet Take 1 tablet (50 mg total) by mouth daily.  . Melatonin 3 MG CAPS Take 1 capsule by mouth at bedtime.  . memantine (NAMENDA) 10 MG tablet TAKE ONE TABLET BY MOUTH TWICE DAILY  . nitroGLYCERIN (NITROSTAT) 0.4 MG SL tablet Place 1 tablet (0.4 mg total) under the tongue  every 5 (five) minutes as needed for chest pain.  . Omega-3 Fatty Acids (FISH OIL) 1200 MG CAPS Take 1 capsule by mouth daily.    . rosuvastatin (CRESTOR) 20 MG tablet TAKE 1 TABLET BY MOUTH EVERY DAY  . triamcinolone cream (KENALOG) 0.1 % Apply 1 application topically 2 (two) times daily.   No facility-administered encounter medications on file as of 01/10/2018.     Activities of Daily Living In your present state of health, do you have any difficulty performing the following activities: 01/10/2018  Hearing? Y  Vision? N  Difficulty concentrating or making decisions? Y  Walking or climbing stairs? N  Dressing or bathing? N  Doing errands, shopping? Y  Preparing Food and eating ? Y  Using the Toilet? N  In the past six months, have you accidently leaked urine? N  Do you have problems with loss of bowel control? N  Managing your Medications? Y  Managing your Finances? Y  Housekeeping or managing your Housekeeping? Y  Some recent data might be hidden    Patient Care Team: Hoyt Koch, MD as PCP - General (Internal Medicine) Verl Blalock, Marijo Conception, MD (Inactive) (Cardiology)   Assessment:   This is a routine wellness examination for Spencer Best. Physical assessment deferred to PCP.   Exercise Activities and Dietary recommendations Current Exercise Habits: Structured exercise class, Type of exercise: stretching;treadmill;calisthenics;strength training/weights, Time (Minutes): 50, Frequency (Times/Week): 3, Weekly Exercise (Minutes/Week): 150, Intensity: Mild, Exercise limited by: None identified  Diet (meal preparation, eat out, water intake, caffeinated beverages, dairy products, fruits and vegetables): in general, a "healthy" diet  , well balanced, eats a variety of fruits and vegetables daily, limits salt, fat/cholesterol, sugar,carbohydrates,caffeine.    Reviewed heart healthy diet,  encouraged patient to increase daily water intake.  Goals    . Patient Stated     Maintain current  health status, stay as healthy and as independent as possible.       Fall Risk Fall Risk  01/10/2018 12/23/2017 12/20/2016 06/23/2016 04/01/2016  Falls in the past year? No No No No Yes  Number falls in past yr: - - - - 2 or more  Injury with Fall? - - - - No  Risk for fall due to : Impaired balance/gait - - - Impaired balance/gait  Follow up - - - - Falls evaluation completed;Education provided;Falls prevention discussed    Depression Screen PHQ 2/9 Scores 01/10/2018 12/17/2015  PHQ - 2 Score 0 0  PHQ- 9 Score 0 -    Cognitive Function MMSE - Mini Mental State Exam 01/10/2018 12/23/2017 12/20/2016  Not completed: - Unable to complete Refused  Orientation to time 2 - -  Orientation to Place 5 - -  Registration 3 - -  Attention/ Calculation 2 - -  Recall 0 - -  Language- name 2 objects 2 - -  Language- repeat 1 - -  Language- follow 3 step command 2 - -  Language- read & follow direction 1 - -  Write a sentence 1 - -  Copy design 1 - -  Total score 20 - -   Montreal Cognitive Assessment  04/01/2016  Visuospatial/ Executive (0/5) 5  Naming (0/3) 3  Attention: Read list of digits (0/2) 2  Attention: Read list of letters (0/1) 1  Attention: Serial 7 subtraction starting at 100 (0/3) 3  Language: Repeat phrase (0/2) 1  Language : Fluency (0/1) 0  Abstraction (0/2) 1  Delayed Recall (0/5) 0  Orientation (0/6) 2  Total 18  Adjusted Score (based on education) 19      Immunization History  Administered Date(s) Administered  . Influenza Split 07/30/2011  . Influenza Whole 07/25/2008, 10/01/2008, 09/09/2009, 08/09/2012  . Influenza, High Dose Seasonal PF 08/11/2016  . Influenza,inj,Quad PF,6+ Mos 08/05/2014  . Influenza-Unspecified 08/09/2013, 09/03/2015, 08/10/2017  . Pneumococcal Conjugate-13 11/27/2013  . Pneumococcal Polysaccharide-23 09/22/2007, 11/05/2016  . Td 10/21/2009  . Zoster 10/14/2009      Screening Tests Health Maintenance  Topic Date Due  . TETANUS/TDAP   10/22/2019  . INFLUENZA VACCINE  Completed  . PNA vac Low Risk Adult  Completed       Plan:    Continue doing brain stimulating activities (puzzles, reading, adult coloring books, staying active) to keep memory sharp.   Continue to eat heart healthy diet (full of fruits, vegetables, whole grains, lean protein, water--limit salt, fat, and sugar intake) and increase physical activity as tolerated.  I have personally reviewed and noted the following in the patient's chart:   . Medical and social history . Use of alcohol, tobacco or illicit drugs  . Current medications and supplements . Functional ability and status . Nutritional status . Physical activity . Advanced directives . List of other physicians . Vitals . Screenings to include cognitive, depression, and falls . Referrals and appointments  In addition, I have reviewed and discussed with patient certain preventive protocols, quality metrics, and best practice recommendations. A written personalized care plan for preventive services as well as general preventive health recommendations were provided to patient.     Michiel Cowboy, RN  01/10/2018

## 2018-01-10 ENCOUNTER — Ambulatory Visit (INDEPENDENT_AMBULATORY_CARE_PROVIDER_SITE_OTHER): Payer: Medicare HMO | Admitting: *Deleted

## 2018-01-10 VITALS — BP 148/62 | HR 57 | Resp 18 | Ht 66.0 in | Wt 181.0 lb

## 2018-01-10 DIAGNOSIS — Z0000 Encounter for general adult medical examination without abnormal findings: Secondary | ICD-10-CM

## 2018-01-10 NOTE — Patient Instructions (Signed)
Continue doing brain stimulating activities (puzzles, reading, adult coloring books, staying active) to keep memory sharp.   Continue to eat heart healthy diet (full of fruits, vegetables, whole grains, lean protein, water--limit salt, fat, and sugar intake) and increase physical activity as tolerated.   Spencer Best , Thank you for taking time to come for your Medicare Wellness Visit. I appreciate your ongoing commitment to your health goals. Please review the following plan we discussed and let me know if I can assist you in the future.   These are the goals we discussed: Goals    . Patient Stated     Maintain current health status, stay as healthy and as independent as possible.       This is a list of the screening recommended for you and due dates:  Health Maintenance  Topic Date Due  . Tetanus Vaccine  10/22/2019  . Flu Shot  Completed  . Pneumonia vaccines  Completed   It is important to avoid accidents which may result in broken bones.  Here are a few ideas on how to make your home safer so you will be less likely to trip or fall.  1. Use nonskid mats or non slip strips in your shower or tub, on your bathroom floor and around sinks.  If you know that you have spilled water, wipe it up! 2. In the bathroom, it is important to have properly installed grab bars on the walls or on the edge of the tub.  Towel racks are NOT strong enough for you to hold onto or to pull on for support. 3. Stairs and hallways should have enough light.  Add lamps or night lights if you need ore light. 4. It is good to have handrails on both sides of the stairs if possible.  Always fix broken handrails right away. 5. It is important to see the edges of steps.  Paint the edges of outdoor steps white so you can see them better.  Put colored tape on the edge of inside steps. 6. Throw-rugs are dangerous because they can slide.  Removing the rugs is the best idea, but if they must stay, add adhesive carpet tape to  prevent slipping. 7. Do not keep things on stairs or in the halls.  Remove small furniture that blocks the halls as it may cause you to trip.  Keep telephone and electrical cords out of the way where you walk. 8. Always were sturdy, rubber-soled shoes for good support.  Never wear just socks, especially on the stairs.  Socks may cause you to slip or fall.  Do not wear full-length housecoats as you can easily trip on the bottom.  9. Place the things you use the most on the shelves that are the easiest to reach.  If you use a stepstool, make sure it is in good condition.  If you feel unsteady, DO NOT climb, ask for help. 10. If a health professional advises you to use a cane or walker, do not be ashamed.  These items can keep you from falling and breaking your bones.

## 2018-01-10 NOTE — Progress Notes (Signed)
Medical screening examination/treatment/procedure(s) were performed by non-physician practitioner and as supervising physician I was immediately available for consultation/collaboration. I agree with above. Seraphim Trow A Furkan Keenum, MD 

## 2018-01-26 DIAGNOSIS — R69 Illness, unspecified: Secondary | ICD-10-CM | POA: Diagnosis not present

## 2018-02-04 ENCOUNTER — Other Ambulatory Visit: Payer: Self-pay | Admitting: Internal Medicine

## 2018-03-22 DIAGNOSIS — R69 Illness, unspecified: Secondary | ICD-10-CM | POA: Diagnosis not present

## 2018-03-23 DIAGNOSIS — Z809 Family history of malignant neoplasm, unspecified: Secondary | ICD-10-CM | POA: Diagnosis not present

## 2018-03-23 DIAGNOSIS — I2510 Atherosclerotic heart disease of native coronary artery without angina pectoris: Secondary | ICD-10-CM | POA: Diagnosis not present

## 2018-03-23 DIAGNOSIS — E785 Hyperlipidemia, unspecified: Secondary | ICD-10-CM | POA: Diagnosis not present

## 2018-03-23 DIAGNOSIS — G309 Alzheimer's disease, unspecified: Secondary | ICD-10-CM | POA: Diagnosis not present

## 2018-03-23 DIAGNOSIS — Z7982 Long term (current) use of aspirin: Secondary | ICD-10-CM | POA: Diagnosis not present

## 2018-03-23 DIAGNOSIS — Z87891 Personal history of nicotine dependence: Secondary | ICD-10-CM | POA: Diagnosis not present

## 2018-03-23 DIAGNOSIS — Z8249 Family history of ischemic heart disease and other diseases of the circulatory system: Secondary | ICD-10-CM | POA: Diagnosis not present

## 2018-03-23 DIAGNOSIS — I252 Old myocardial infarction: Secondary | ICD-10-CM | POA: Diagnosis not present

## 2018-03-23 DIAGNOSIS — Z806 Family history of leukemia: Secondary | ICD-10-CM | POA: Diagnosis not present

## 2018-03-23 DIAGNOSIS — I10 Essential (primary) hypertension: Secondary | ICD-10-CM | POA: Diagnosis not present

## 2018-04-05 DIAGNOSIS — H2513 Age-related nuclear cataract, bilateral: Secondary | ICD-10-CM | POA: Diagnosis not present

## 2018-04-05 DIAGNOSIS — H40012 Open angle with borderline findings, low risk, left eye: Secondary | ICD-10-CM | POA: Diagnosis not present

## 2018-05-16 ENCOUNTER — Telehealth: Payer: Self-pay | Admitting: Internal Medicine

## 2018-05-16 NOTE — Telephone Encounter (Signed)
Copied from [place] [id number]. Topic: Quick Communication - Rx Refill/Question >> May 16, 2018  1:48 PM Mcneil, Ja-Kwan wrote: Medication: nitroGLYCERIN (NITROSTAT) 0.4 MG SL tablet  Has the patient contacted their pharmacy? yes   Preferred Pharmacy (with phone number or street name): [id number] - [place], [place] [address] [phone] (Phone) [phone] (Fax)   Agent: Please be advised that RX refills may take up to 3 business days. We ask that you follow-up with your pharmacy.

## 2018-05-17 NOTE — Telephone Encounter (Signed)
Patient called and spoke with his wife, Zaim Nitta about the NItroglycerin refill request. I advised it was last filled by the cardiologist and unless he said to have the PCP to refill, that office will need to be called for the refill, she verbalized understanding and says she will call tomorrow.

## 2018-05-18 ENCOUNTER — Other Ambulatory Visit: Payer: Self-pay | Admitting: *Deleted

## 2018-05-18 ENCOUNTER — Telehealth: Payer: Self-pay | Admitting: Cardiology

## 2018-05-18 MED ORDER — NITROGLYCERIN 0.4 MG SL SUBL: 0.4000 mg | SUBLINGUAL_TABLET | SUBLINGUAL | 0 refills | Status: DC | PRN

## 2018-05-18 NOTE — Telephone Encounter (Signed)
°*  STAT* If patient is at the pharmacy, call can be transferred to refill team.   1. Which medications need to be refilled? (please list name of each medication and dose if known) Nitroglycerin  2. Which pharmacy/location (including street and city if local pharmacy) is medication to be sent to?[facility]  [address] [place]. Do they need a 30 day or 90 day supply? *the lease amount he can get

## 2018-06-29 ENCOUNTER — Encounter: Payer: Self-pay | Admitting: Neurology

## 2018-07-20 ENCOUNTER — Other Ambulatory Visit: Payer: Self-pay | Admitting: Neurology

## 2018-08-17 DIAGNOSIS — R69 Illness, unspecified: Secondary | ICD-10-CM | POA: Diagnosis not present

## 2018-10-26 ENCOUNTER — Other Ambulatory Visit: Payer: Self-pay | Admitting: Neurology

## 2018-11-29 ENCOUNTER — Other Ambulatory Visit: Payer: Self-pay | Admitting: Cardiology

## 2018-12-18 ENCOUNTER — Ambulatory Visit: Payer: Medicare HMO | Admitting: Cardiology

## 2018-12-23 NOTE — Progress Notes (Signed)
Follow-up Visit   Date: 12/25/18    Spencer Best MRN: [id number] DOB: [birth year]   Interim History: Spencer Best is a 85 y.o. right-handed Caucasian male with CAD, hyperlipidemia, hypertension, and vasovagal syncope returning to the clinic for follow-up of Alzheimer's disease.  The patient was accompanied to the clinic by wife who also provides collateral information.    History of present illness: Late 2016, wife noticed that he would frequently ask her about doctor appointments, misplaces objects, and frequently forgets the date. He has problems with short-term memory moreso than long-term.  He continues to drive and has not been involved in any MVAs.  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  He usually always travels with his wife and she had does not endorse any safety issues with driving.  He manages household finances, but his wife does oversee it.  He does is own ADLs and walks independently.    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  He reports forgetting to chores on his farms.  He continues to be very active on the farm - moves the cattle every 2-3 days, mowing, places dividers in the farm, goes out to eat.    Neuropsychological testing confirming Alzheimer's dementia (mild).  Since 2017, he has been relatively stable with respect to memory.  He active on the farm and wife allows him to drive locally to familiar places, but anything long distance, she always accompanies him.   They sold some of their cattle and now have 20 cows which they manage together. Wife states that they are trying to limit the amount of farm work, but keeping the cattle allows them to claim farmland on their 80+ acres.   They manages the finances together   UPDATE 12/23/2017:  He is here with his wife for 1 year follow-up visit.  He has been relatively stable with respect to  his health.  Wife states that he tends to repeat the same questions often, such as what the plans for the next day are.  She denies any changes in his behavior or getting lost. He continues to drive local distances only and wife feels that he is still safe doing this; he does not drive alone.  Wife manages the finances, but they discuss financial decision together. Wife manages his medication in a pill box, and he usually remembers to take it himself.  They stay very active and go to [employer] about twice per week.    UPDATE 12/23/2018:   He is here for 1 year follow-up visit.  Wife has noticed gradual declined over the past year.  He is no longer working on the farm.  He is no longer driving and does not manage finances and medications.  Medication are given to him.  He is able to keep up with personal hygiene, dressing, eating, and transferring.  Sometimes he forget to brush his teeth.  He has become more sedentary.  He enjoys playing with his puppy, puzzles, and reading magazines.  He does not assisted with meal prep, housework, or other chores. Mood is good.  No behavior changes.   Medications:  Current Outpatient Medications on File Prior to Visit  Medication Sig Dispense Refill  . aspirin 81 MG tablet Take 81 mg by mouth daily.    . cyanocobalamin 1000 MCG tablet  Take 1,000 mcg by mouth daily.    Marland Kitchen losartan (COZAAR) 50 MG tablet TAKE 1 TABLET BY MOUTH EVERY DAY 90 tablet 0  . Melatonin 3 MG CAPS Take 1 capsule by mouth at bedtime.    . nitroGLYCERIN (NITROSTAT) 0.4 MG SL tablet Place 1 tablet (0.4 mg total) under the tongue every 5 (five) minutes x 3 doses as needed for chest pain. 25 tablet 0  . Omega-3 Fatty Acids (FISH OIL) 1200 MG CAPS Take 1 capsule by mouth daily.      Marland Kitchen triamcinolone cream (KENALOG) 0.1 % Apply 1 application topically 2 (two) times daily. 453.6 g 0  . rosuvastatin (CRESTOR) 20 MG tablet TAKE 1 TABLET BY MOUTH EVERY DAY 90 tablet 0   No current facility-administered  medications on file prior to visit.     Allergies: No Known Allergies  Review of Systems:  CONSTITUTIONAL: No fevers, chills, night sweats, or weight loss.  EYES: No visual changes or eye pain ENT: No hearing changes.  No history of nose bleeds.   RESPIRATORY: No cough, wheezing and shortness of breath.   CARDIOVASCULAR: Negative for chest pain, and palpitations.   GI: Negative for abdominal discomfort, blood in stools or black stools.  No recent change in bowel habits.   GU:  No history of incontinence.   MUSCLOSKELETAL: No history of joint pain or swelling.  No myalgias.   SKIN: Negative for lesions, rash, and itching.   ENDOCRINE: Negative for cold or heat intolerance, polydipsia or goiter.   PSYCH:  No depression or anxiety symptoms.   NEURO: As Above.   Vital Signs:  BP 140/60   Pulse (!) 59   Ht 5\' 6"  (1.676 m)   Wt 184 lb (83.5 kg)   SpO2 97%   BMI 29.70 kg/m   General Medical Exam:   General:  Well appearing, comfortable  Eyes/ENT: see cranial nerve examination.   Neck:   No carotid bruits. Respiratory:  Clear to auscultation, good air entry bilaterally.   Cardiac:  Regular rate and rhythm, no murmur.   Ext:  No edema  Neurological Exam: MENTAL STATUS including orientation to time, place, person, recent and remote memory, attention span and concentration, language, and fund of knowledge is poor.  Affect is severely blunted and he does not engage well in conversation.  He does know his DOB.  He does not know his street address or home phone number.   Montreal Cognitive Assessment  12/25/2018 04/01/2016  Visuospatial/ Executive (0/5) 2 5  Naming (0/3) 2 3  Attention: Read list of digits (0/2) 1 2  Attention: Read list of letters (0/1) 0 1  Attention: Serial 7 subtraction starting at 100 (0/3) 1 3  Language: Repeat phrase (0/2) 1 1  Language : Fluency (0/1) 0 0  Abstraction (0/2) 0 1  Delayed Recall (0/5) 0 0  Orientation (0/6) 2 2  Total 9 18  Adjusted Score  (based on education) 10 19    CRANIAL NERVES: Pupils equal round and reactive to light.  Normal conjugate, extra-ocular eye movements in all directions of gaze.  Right ptosis (old).  Face is symmetric.  MOTOR:  Motor strength is 5/5 in all extremities.   Tone is slightly increased in the legs.    MSRs:  Reflexes are 2+/4 throughout, except 3+ bilateral patella jerks.    COORDINATION/GAIT:   Finger and toe tapping is normal. Gait appears very ataxic, unassisted.  Data: Lab Results  Component Value Date   TSH  4.39 03/30/2017   Lab Results  Component Value Date   VITAMINB12 866 03/30/2017   MRI brain wo contrast 04/13/2016: No acute infarct. Tiny areas of blood breakdown products greater in the frontal lobes suggestive of result of prior hemorrhagic ischemia. Remote right frontal lobe infarct. Moderate chronic microvascular changes. Thin-section imaging through the internal auditory canal was not performed and contrast not administered. Taking this limitation into account, no obvious internal auditory canal lesion is identified. Mild global atrophy without hydrocephalus.   IMPRESSION/PLAN: 1.  Alzheimer's dementia without behavior change, moderate requiring assistance with all IADLs.  He is still able to perform basic ADLs.  There are no home safety issues.  He is no longer driving. Continue namenda 10mg  twice daily He is not a candidate for acetylcholinesterase inhibitor therapy due to bradycardia.  Encouraged him to continue to stay active and engage in mentally stimulating activities Information provided for dementia and caregiver care as well as community resources.  Wife feels she does not need assistance for care as this time.   Advanced directive discussed and has been completed, I have asked them to provide a copy for our records  2.  Gait ataxia.  This may be due to lumbar canal stenosis as his reflexes are brisk in the legs.  I will check MRI lumbar spine without contrast  and check vitamin B12, MMA, TSH, and copper levels.  Recommend that he always use a cane.  He did not have any benefit with PT.  Fall precautions discussed.   Return to clinic in 1 year, or sooner as needed   Thank you for allowing me to participate in patient's care.  If I can answer any additional questions, I would be pleased to do so.    Sincerely,    Donika K. Posey Pronto, DO

## 2018-12-24 ENCOUNTER — Other Ambulatory Visit: Payer: Self-pay | Admitting: Internal Medicine

## 2018-12-25 ENCOUNTER — Ambulatory Visit: Payer: Medicare HMO | Admitting: Neurology

## 2018-12-25 ENCOUNTER — Encounter: Payer: Self-pay | Admitting: Neurology

## 2018-12-25 ENCOUNTER — Other Ambulatory Visit (INDEPENDENT_AMBULATORY_CARE_PROVIDER_SITE_OTHER): Payer: Medicare HMO

## 2018-12-25 ENCOUNTER — Other Ambulatory Visit: Payer: Self-pay | Admitting: *Deleted

## 2018-12-25 VITALS — BP 140/60 | HR 59 | Ht 66.0 in | Wt 184.0 lb

## 2018-12-25 DIAGNOSIS — G301 Alzheimer's disease with late onset: Secondary | ICD-10-CM | POA: Diagnosis not present

## 2018-12-25 DIAGNOSIS — R7989 Other specified abnormal findings of blood chemistry: Secondary | ICD-10-CM

## 2018-12-25 DIAGNOSIS — R6889 Other general symptoms and signs: Secondary | ICD-10-CM | POA: Diagnosis not present

## 2018-12-25 DIAGNOSIS — R2681 Unsteadiness on feet: Secondary | ICD-10-CM

## 2018-12-25 DIAGNOSIS — R292 Abnormal reflex: Secondary | ICD-10-CM

## 2018-12-25 DIAGNOSIS — R69 Illness, unspecified: Secondary | ICD-10-CM | POA: Diagnosis not present

## 2018-12-25 DIAGNOSIS — F0280 Dementia in other diseases classified elsewhere without behavioral disturbance: Secondary | ICD-10-CM | POA: Diagnosis not present

## 2018-12-25 LAB — VITAMIN B12: Vitamin B-12: 1001 pg/mL — ABNORMAL HIGH (ref 211–911)

## 2018-12-25 LAB — TSH: TSH: 5.75 u[IU]/mL — ABNORMAL HIGH (ref 0.35–4.50)

## 2018-12-25 MED ORDER — MEMANTINE HCL 10 MG PO TABS: 10.0000 mg | ORAL_TABLET | Freq: Two times a day (BID) | ORAL | 3 refills | Status: DC

## 2018-12-25 NOTE — Patient Instructions (Addendum)
Check labs  MRI lumbar spine   Always use a cane when walking  Return to clinic in 1 year

## 2018-12-26 ENCOUNTER — Other Ambulatory Visit: Payer: Self-pay | Admitting: *Deleted

## 2018-12-26 ENCOUNTER — Telehealth: Payer: Self-pay | Admitting: *Deleted

## 2018-12-26 DIAGNOSIS — R6889 Other general symptoms and signs: Secondary | ICD-10-CM

## 2018-12-26 DIAGNOSIS — R7989 Other specified abnormal findings of blood chemistry: Secondary | ICD-10-CM

## 2018-12-26 LAB — T3, FREE: T3, Free: 3.4 pg/mL (ref 2.3–4.2)

## 2018-12-26 LAB — T4, FREE: Free T4: 0.68 ng/dL (ref 0.60–1.60)

## 2018-12-26 NOTE — Telephone Encounter (Signed)
-----   Message from Donika K Patel, DO sent at 12/25/2018  5:38 PM EST ----- Please see if lab can add free T3 and free T4. Thanks 

## 2018-12-26 NOTE — Telephone Encounter (Signed)
New labs ordered 

## 2018-12-27 ENCOUNTER — Telehealth: Payer: Self-pay | Admitting: *Deleted

## 2018-12-27 ENCOUNTER — Ambulatory Visit: Payer: Medicare HMO | Admitting: Cardiology

## 2018-12-27 LAB — COPPER, SERUM: Copper: 100 ug/dL (ref 70–175)

## 2018-12-27 LAB — METHYLMALONIC ACID, SERUM: Methylmalonic Acid, Quant: 142 nmol/L (ref 87–318)

## 2018-12-27 NOTE — Telephone Encounter (Signed)
Informed patient's wife that his labs are normal.

## 2018-12-27 NOTE — Telephone Encounter (Signed)
-----   Message from Alda Berthold, DO sent at 12/27/2018 11:15 AM EST ----- Please notify patient lab are within normal limits.  Thank you.

## 2019-01-01 DIAGNOSIS — Z23 Encounter for immunization: Secondary | ICD-10-CM | POA: Diagnosis not present

## 2019-01-01 DIAGNOSIS — Z6828 Body mass index (BMI) 28.0-28.9, adult: Secondary | ICD-10-CM | POA: Diagnosis not present

## 2019-01-01 DIAGNOSIS — I2510 Atherosclerotic heart disease of native coronary artery without angina pectoris: Secondary | ICD-10-CM | POA: Diagnosis not present

## 2019-01-01 DIAGNOSIS — Z2821 Immunization not carried out because of patient refusal: Secondary | ICD-10-CM | POA: Diagnosis not present

## 2019-01-03 ENCOUNTER — Ambulatory Visit: Payer: Medicare HMO | Admitting: Internal Medicine

## 2019-01-03 NOTE — Progress Notes (Signed)
Cardiology Office Note:    Date:  01/04/2019   ID:  EMAAD [name], DOB Aug 10, 1933, MRN [id number]  PCP:  Ronita Hipps, MD  Cardiologist:  Shirlee More, MD    Referring MD: Hoyt Koch, *    ASSESSMENT:    1. Coronary artery disease of native artery of native heart with stable angina pectoris ([place])   2. Essential hypertension   3. Mixed hyperlipidemia    PLAN:    In order of problems listed above:  1. His predominant comorbidity is his dementia quite severe he is very sedentary in his voice no chest pain.  I spoke to his wife I think the best thing to do is continue medical treatment including aspirin statin nitroglycerin as needed at this time I do not think he benefit from cardiac diagnostic testing ischemia evaluation. 2. Stable repeat blood pressure by me 142/60 his mean arterial pressure is normal and continue treatment with low-dose ARB 3. Stable continue his statin I was going to order labs and his wife come there at the [facility] hospital in November and they had renal function liver function and lipids checked and the [place] told him his numbers were at target   Next appointment: 1 year   Medication Adjustments/Labs and Tests Ordered: Current medicines are reviewed at length with the patient today.  Concerns regarding medicines are outlined above.  No orders of the defined types were placed in this encounter.  No orders of the defined types were placed in this encounter.   Chief Complaint  Patient presents with   Follow-up    for    Coronary Artery Disease    CABG 1994   Hypertension   Hyperlipidemia    History of Present Illness:    Spencer [name] is a 85 y.o. male with a hx of CAD with bypass surgery in 1994.  In January 2016 he had a syncopal episode and was in the emergency room. No etiology was identified. He subsequently had a stress perfusion study which demonstrated a fixed inferior and apical defect with an EF of 53% which was felt to be low  risk.  He did wear a monitor which demonstrated no significant arrhythmias.  He was  last seen 12/15/17 by Dr Percival Spanish.. Compliance with diet, lifestyle and medications: Yes  His wife is present dementia become increasingly a problem but she still can take care of him at home he is quite an active but has had no chest pain shortness of breath edema palpitation or syncope.  He tolerates his medications and labs were checked at the [facility] hospital in November.  She forgot to bring the list with her.  She requested new prescription nitroglycerin if needed Past Medical History:  Diagnosis Date   HEMORRHOIDS, HX OF    HYPERLIPIDEMIA    HYPERTENSION    LABRYNTHITIS    MYOCARDIAL INFARCTION, INFERIOR WALL, SUBSEQUENT CARE 06/11/2009   PERCUTANEOUS TRANSLUMINAL CORONARY ANGIOPLASTY, HX OF 08/16/2007   SYNCOPE, VASOVAGAL     Past Surgical History:  Procedure Laterality Date   CARDIAC CATHETERIZATION     CORONARY ANGIOPLASTY     CORONARY ARTERY BYPASS GRAFT     L internal mammary artery to L anterior, saphenous vein graft [Other]   correction of abdominal birth defect     Age 83    Current Medications: Current Meds  Medication Sig   aspirin 81 MG tablet Take 81 mg by mouth daily.   chlorpheniramine (CHLOR-TRIMETON) 4 MG tablet Take 4  mg by mouth 2 (two) times daily as needed for allergies.   cyanocobalamin 1000 MCG tablet Take 1,000 mcg by mouth daily.   losartan (COZAAR) 50 MG tablet TAKE 1 TABLET BY MOUTH EVERY DAY   memantine (NAMENDA) 10 MG tablet Take 1 tablet (10 mg total) by mouth 2 (two) times daily.   nitroGLYCERIN (NITROSTAT) 0.4 MG SL tablet Place 1 tablet (0.4 mg total) under the tongue every 5 (five) minutes x 3 doses as needed for chest pain.   Omega-3 Fatty Acids (FISH OIL) 1200 MG CAPS Take 1 capsule by mouth daily.     rosuvastatin (CRESTOR) 20 MG tablet TAKE 1 TABLET BY MOUTH EVERY DAY   triamcinolone cream (KENALOG) 0.1 % Apply 1 application topically 2  (two) times daily.     Allergies:   Patient has no known allergies.   Social History   Socioeconomic History   Marital status: Married    Spouse name: Not on file   Number of children: 2   Years of education: 12   Highest education level: Not on file  Occupational History   Occupation: Full time on farm   Occupation: RETIRED    Fish farm manager: RETIRED  Social Designer, fashion/clothing strain: Not hard at all   Food insecurity:    Worry: Never true    Inability: Never true   Transportation needs:    Medical: No    Non-medical: No  Tobacco Use   Smoking status: Former Smoker    Types: Cigarettes, Cigars    Last attempt to quit: 06/25/1971    Years since quitting: 47.5   Smokeless tobacco: Never Used  Substance and Sexual Activity   Alcohol use: No    Alcohol/week: 0.0 standard drinks   Drug use: No   Sexual activity: Not Currently  Lifestyle   Physical activity:    Days per week: 3 days    Minutes per session: 60 min   Stress: Not at all  Relationships   Social connections:    Talks on phone: More than three times a week    Gets together: More than three times a week    Attends religious service: More than 4 times per year    Active member of club or organization: Yes    Attends meetings of clubs or organizations: More than 4 times per year    Relationship status: Married  Other Topics Concern   Not on file  Social History Narrative   HSG Married 1955. 1 son 35, 1 daughter 66, 3 grandchildren.    Work full time on the farm, Manufacturing engineer. Marriage in good health. ACP - discussed with him and his wife. Referred to [email or web address].  Lives in a one story home.     Education: high school.     Family History: The patient's family history includes Arrhythmia in his mother; Coronary artery disease in his father; Diverticulitis in his mother; Healthy in his daughter and son; Leukemia in his mother; Lung cancer in his brother;  Osteoarthritis in his father; Parkinsonism in his father; Rheum arthritis in his father. There is no history of Colon cancer or Prostate cancer. ROS:   Please see the history of present illness.    All other systems reviewed and are negative.  EKGs/Labs/Other Studies Reviewed:    The following studies were reviewed today:  EKG:  EKG ordered today and personally reviewed.  The ekg ordered today demonstrates shows sinus rhythm 1 PVC minor nonspecific ST change otherwise  normal  Recent Labs: 12/25/2018: TSH 5.75  Recent Lipid Panel    Component Value Date/Time   CHOL 121 12/26/2017 1015   TRIG 146.0 12/26/2017 1015   HDL 44.90 12/26/2017 1015   CHOLHDL 3 12/26/2017 1015   VLDL 29.2 12/26/2017 1015   LDLCALC 47 12/26/2017 1015    Physical Exam:    VS:  BP (!) 160/62 (BP Location: Right Arm, Patient Position: Sitting, Cuff Size: Normal)    Pulse 69    Ht 5\' 6"  (1.676 m)    Wt 188 lb 12.8 oz (85.6 kg)    SpO2 95%    BMI 30.47 kg/m     Wt Readings from Last 3 Encounters:  01/04/19 188 lb 12.8 oz (85.6 kg)  12/25/18 184 lb (83.5 kg)  01/10/18 181 lb (82.1 kg)     GEN:  Well nourished, well developed in no acute distress HEENT: Normal NECK: No JVD; No carotid bruits LYMPHATICS: No lymphadenopathy CARDIAC: RRR, no murmurs, rubs, gallops RESPIRATORY:  Clear to auscultation without rales, wheezing or rhonchi  ABDOMEN: Soft, non-tender, non-distended MUSCULOSKELETAL:  No edema; No deformity  SKIN: Warm and dry NEUROLOGIC:  Alert and oriented x 3 PSYCHIATRIC:  Normal affect    Signed, Shirlee More, MD  01/04/2019 3:44 PM    [facility]

## 2019-01-04 ENCOUNTER — Other Ambulatory Visit: Payer: Self-pay

## 2019-01-04 ENCOUNTER — Ambulatory Visit (INDEPENDENT_AMBULATORY_CARE_PROVIDER_SITE_OTHER): Payer: Medicare HMO | Admitting: Cardiology

## 2019-01-04 ENCOUNTER — Encounter: Payer: Self-pay | Admitting: Cardiology

## 2019-01-04 VITALS — BP 160/62 | HR 69 | Ht 66.0 in | Wt 188.8 lb

## 2019-01-04 DIAGNOSIS — I10 Essential (primary) hypertension: Secondary | ICD-10-CM

## 2019-01-04 DIAGNOSIS — I25118 Atherosclerotic heart disease of native coronary artery with other forms of angina pectoris: Secondary | ICD-10-CM | POA: Diagnosis not present

## 2019-01-04 DIAGNOSIS — R55 Syncope and collapse: Secondary | ICD-10-CM | POA: Diagnosis not present

## 2019-01-04 DIAGNOSIS — E782 Mixed hyperlipidemia: Secondary | ICD-10-CM | POA: Diagnosis not present

## 2019-01-04 MED ORDER — NITROGLYCERIN 0.4 MG SL SUBL: 0.4000 mg | SUBLINGUAL_TABLET | SUBLINGUAL | 11 refills | Status: DC | PRN

## 2019-01-04 NOTE — Patient Instructions (Signed)

## 2019-01-11 ENCOUNTER — Other Ambulatory Visit: Payer: Self-pay

## 2019-01-11 ENCOUNTER — Ambulatory Visit
Admission: RE | Admit: 2019-01-11 | Discharge: 2019-01-11 | Disposition: A | Discharge status: Home / Self Care | Payer: Medicare HMO | Source: Ambulatory Visit | Attending: Neurology | Admitting: Neurology

## 2019-01-11 DIAGNOSIS — R292 Abnormal reflex: Secondary | ICD-10-CM

## 2019-01-11 DIAGNOSIS — M5136 Other intervertebral disc degeneration, lumbar region: Secondary | ICD-10-CM | POA: Diagnosis not present

## 2019-01-11 DIAGNOSIS — R2681 Unsteadiness on feet: Secondary | ICD-10-CM

## 2019-01-12 ENCOUNTER — Telehealth: Payer: Self-pay | Admitting: *Deleted

## 2019-01-12 NOTE — Telephone Encounter (Signed)
-----   Message from Alda Berthold, DO sent at 01/12/2019  9:49 AM EDT ----- Please inform patient that his MRI lumbar spine looks good, only mild age-related changes of the spine, nothing severe causing nerve impingement or his unsteadiness.  Thanks.

## 2019-01-12 NOTE — Telephone Encounter (Signed)
Called and gave patient's wife results.

## 2019-03-06 ENCOUNTER — Other Ambulatory Visit: Payer: Self-pay

## 2019-03-06 MED ORDER — LOSARTAN POTASSIUM 50 MG PO TABS: 50.0000 mg | ORAL_TABLET | Freq: Every day | ORAL | 1 refills | Status: DC

## 2019-03-07 ENCOUNTER — Other Ambulatory Visit: Payer: Self-pay

## 2019-03-18 ENCOUNTER — Other Ambulatory Visit: Payer: Self-pay | Admitting: Internal Medicine

## 2019-04-28 ENCOUNTER — Other Ambulatory Visit: Payer: Self-pay | Admitting: Internal Medicine

## 2019-05-15 ENCOUNTER — Other Ambulatory Visit: Payer: Self-pay | Admitting: Internal Medicine

## 2019-05-22 ENCOUNTER — Other Ambulatory Visit: Payer: Self-pay | Admitting: Cardiology

## 2019-05-22 ENCOUNTER — Telehealth: Payer: Self-pay | Admitting: Cardiology

## 2019-05-22 MED ORDER — ROSUVASTATIN CALCIUM 20 MG PO TABS: 20.0000 mg | ORAL_TABLET | Freq: Every day | ORAL | 2 refills | Status: DC

## 2019-05-22 NOTE — Telephone Encounter (Signed)
°*  STAT* If patient is at the pharmacy, call can be transferred to refill team.   1. Which medications need to be refilled? (please list name of each medication and dose if known) rosuvastatin (CRESTOR) 20   2. Which pharmacy/location (including street and city if local pharmacy) is medication to be sent to?  [id number] - [place], [address] [phone] (Phone) [phone] (Fax)    3. Do they need a 30 day or 90 day supply? [place]

## 2019-06-11 DIAGNOSIS — R69 Illness, unspecified: Secondary | ICD-10-CM | POA: Diagnosis not present

## 2019-06-18 DIAGNOSIS — H2513 Age-related nuclear cataract, bilateral: Secondary | ICD-10-CM | POA: Diagnosis not present

## 2019-06-27 DIAGNOSIS — R69 Illness, unspecified: Secondary | ICD-10-CM | POA: Diagnosis not present

## 2019-07-31 DIAGNOSIS — R69 Illness, unspecified: Secondary | ICD-10-CM | POA: Diagnosis not present

## 2019-08-17 DIAGNOSIS — H6123 Impacted cerumen, bilateral: Secondary | ICD-10-CM | POA: Diagnosis not present

## 2019-08-25 ENCOUNTER — Other Ambulatory Visit: Payer: Self-pay | Admitting: Cardiology

## 2019-09-17 DIAGNOSIS — L578 Other skin changes due to chronic exposure to nonionizing radiation: Secondary | ICD-10-CM | POA: Diagnosis not present

## 2019-09-17 DIAGNOSIS — D0439 Carcinoma in situ of skin of other parts of face: Secondary | ICD-10-CM | POA: Diagnosis not present

## 2019-12-27 ENCOUNTER — Encounter: Payer: Self-pay | Admitting: Neurology

## 2019-12-28 ENCOUNTER — Other Ambulatory Visit: Payer: Self-pay

## 2019-12-28 ENCOUNTER — Telehealth (INDEPENDENT_AMBULATORY_CARE_PROVIDER_SITE_OTHER): Payer: Medicare HMO | Admitting: Neurology

## 2019-12-28 VITALS — Ht 66.0 in | Wt 185.0 lb

## 2019-12-28 DIAGNOSIS — G301 Alzheimer's disease with late onset: Secondary | ICD-10-CM

## 2019-12-28 DIAGNOSIS — F0280 Dementia in other diseases classified elsewhere without behavioral disturbance: Secondary | ICD-10-CM

## 2019-12-28 DIAGNOSIS — R69 Illness, unspecified: Secondary | ICD-10-CM | POA: Diagnosis not present

## 2019-12-28 DIAGNOSIS — R2681 Unsteadiness on feet: Secondary | ICD-10-CM

## 2019-12-28 MED ORDER — MEMANTINE HCL 10 MG PO TABS: 10.0000 mg | ORAL_TABLET | Freq: Two times a day (BID) | ORAL | 3 refills | Status: AC

## 2019-12-28 NOTE — Progress Notes (Signed)
   Due to the COVID-19 crisis, this virtual check-in visit was done via telephone from my office and it was initiated and consent given by this patient and or family.   Telephone (Audio) Visit The purpose of this telephone visit is to provide medical care while limiting exposure to the novel coronavirus.    Consent was obtained for telephone visit and initiated by pt/family:  Yes.    Answered questions that patient had about telehealth interaction:  Yes.   I discussed the limitations, risks, security and privacy concerns of performing an evaluation and management service by telephone. I also discussed with the patient that there may be a patient responsible charge related to this service. The patient expressed understanding and agreed to proceed.  Pt location: Home Physician Location: office Name of referring provider:  Hoyt Koch, * I connected with .Nikki Dom Burdett at patients initiation/request on 12/28/2019 at  1:30 PM EST by telephone and verified that I am speaking with the correct person using two identifiers.  Pt MRN:  [id number] Pt DOB:  25-Dec-1932   History of Present Illness: This is a 86 year-old man returning for follow-up of Alzheimer's dementia. Over the past year, there has been progressive decline in independent functioning.  His wife assists in many ADLs, such as bathing, dressing, and often reminded him about personal hygiene.  He has started to wear Depends.  She is having a harder time trying to care for him because of her own hand pain related to polymyalgia.  She manages medications, finances, and drives.  His memory is getting worse.  He is still able to recognize family and where he is at.  There is no behavior changes hallucinations.  Has suffered a few falls and was given a walker by the [place], but does not use it properly.   Assessment and Plan:   Alzheimer's dementia (moderate) with increasing assistance with ADLs, worse as expected with the course of the  disease. He is on namenda 10mg  BID which will be continued. Wife is his primary care giver and expresses difficulty with being able to keep up with his needs.  They would benefit from in-home assistance.  I place a referral for home health nursing for assistance with medication management and ADLs and home PT for gait training.   Follow Up Instructions:   I discussed the assessment and treatment plan with the patient. The patient was provided an opportunity to ask questions and all were answered. The patient agreed with the plan and demonstrated an understanding of the instructions.   The patient was advised to call back or seek an in-person evaluation if the symptoms worsen or if the condition fails to improve as anticipated.  Follow-up in 1 year, or sooner as needed  Total Time spent in visit with the patient was:  25 min, of which 100% of the time was spent in counseling and/or coordinating care.   Pt understands and agrees with the plan of care outlined.     Alda Berthold, DO

## 2019-12-28 NOTE — Progress Notes (Signed)
Awaiting home health to contact office for [place].

## 2020-01-04 ENCOUNTER — Other Ambulatory Visit: Payer: Self-pay | Admitting: Cardiology

## 2020-01-11 ENCOUNTER — Other Ambulatory Visit: Payer: Self-pay | Admitting: Cardiology

## 2020-01-12 DIAGNOSIS — R69 Illness, unspecified: Secondary | ICD-10-CM | POA: Diagnosis not present

## 2020-01-12 DIAGNOSIS — I2510 Atherosclerotic heart disease of native coronary artery without angina pectoris: Secondary | ICD-10-CM | POA: Diagnosis not present

## 2020-01-12 DIAGNOSIS — Z008 Encounter for other general examination: Secondary | ICD-10-CM | POA: Diagnosis not present

## 2020-01-12 DIAGNOSIS — R269 Unspecified abnormalities of gait and mobility: Secondary | ICD-10-CM | POA: Diagnosis not present

## 2020-01-12 DIAGNOSIS — R32 Unspecified urinary incontinence: Secondary | ICD-10-CM | POA: Diagnosis not present

## 2020-01-12 DIAGNOSIS — I10 Essential (primary) hypertension: Secondary | ICD-10-CM | POA: Diagnosis not present

## 2020-01-12 DIAGNOSIS — Z7982 Long term (current) use of aspirin: Secondary | ICD-10-CM | POA: Diagnosis not present

## 2020-01-12 DIAGNOSIS — Z7409 Other reduced mobility: Secondary | ICD-10-CM | POA: Diagnosis not present

## 2020-01-12 DIAGNOSIS — I739 Peripheral vascular disease, unspecified: Secondary | ICD-10-CM | POA: Diagnosis not present

## 2020-01-12 DIAGNOSIS — E785 Hyperlipidemia, unspecified: Secondary | ICD-10-CM | POA: Diagnosis not present

## 2020-01-12 DIAGNOSIS — I252 Old myocardial infarction: Secondary | ICD-10-CM | POA: Diagnosis not present

## 2020-01-22 DIAGNOSIS — R69 Illness, unspecified: Secondary | ICD-10-CM | POA: Diagnosis not present

## 2020-01-29 ENCOUNTER — Other Ambulatory Visit: Payer: Self-pay

## 2020-01-29 ENCOUNTER — Ambulatory Visit (INDEPENDENT_AMBULATORY_CARE_PROVIDER_SITE_OTHER): Payer: Medicare HMO | Admitting: Cardiology

## 2020-01-29 ENCOUNTER — Encounter: Payer: Self-pay | Admitting: Cardiology

## 2020-01-29 ENCOUNTER — Other Ambulatory Visit: Payer: Self-pay | Admitting: Cardiology

## 2020-01-29 VITALS — BP 132/58 | HR 59 | Temp 97.6°F | Ht 66.0 in | Wt 189.8 lb

## 2020-01-29 DIAGNOSIS — I10 Essential (primary) hypertension: Secondary | ICD-10-CM | POA: Diagnosis not present

## 2020-01-29 DIAGNOSIS — R55 Syncope and collapse: Secondary | ICD-10-CM

## 2020-01-29 DIAGNOSIS — E782 Mixed hyperlipidemia: Secondary | ICD-10-CM

## 2020-01-29 DIAGNOSIS — I25118 Atherosclerotic heart disease of native coronary artery with other forms of angina pectoris: Secondary | ICD-10-CM

## 2020-01-29 MED ORDER — NITROGLYCERIN 0.4 MG SL SUBL: 0.4000 mg | SUBLINGUAL_TABLET | SUBLINGUAL | 11 refills | Status: AC | PRN

## 2020-01-29 NOTE — Progress Notes (Addendum)
Cardiology Office Note:    Date:  01/29/2020   ID:  Spencer Best, DOB 1933/10/17, MRN [id number]  PCP:  Ronita Hipps, MD  Cardiologist:  Shirlee More, MD    Referring MD: Ronita Hipps, MD    ASSESSMENT:    1. Coronary artery disease of native artery of native heart with stable angina pectoris ([place])   2. Essential hypertension   3. Mixed hyperlipidemia   4. SYNCOPE, VASOVAGAL    PLAN:    In order of problems listed above:  1. Stable CAD continue medical therapy having no angina on current regimen the patient is wife in 3 - status and he was clearly ablation we gave him a new prescription for nitroglycerin to take as needed 2. Blood pressure stable at target continue treatment ARB 3. Stable lipids continue his high intensity statin.  I reinforced diet is very important.  He will be able to see labs and she assures me she will bring a copy to the office 4. No recurrent syncope  Recent labs obtained from the [facility] hospital today cholesterol 126 triglycerides 205 HDL 45 and LDL is at target of 40 with normal liver function test.  Next appointment: 1 yr   Medication Adjustments/Labs and Tests Ordered: Current medicines are reviewed at length with the patient today.  Concerns regarding medicines are outlined above.  No orders of the defined types were placed in this encounter.  Meds ordered this encounter  Medications  . nitroGLYCERIN (NITROSTAT) 0.4 MG SL tablet    Sig: Place 1 tablet (0.4 mg total) under the tongue every 5 (five) minutes x 3 doses as needed for chest pain.    Dispense:  25 tablet    Refill:  11    No chief complaint on file.   History of Present Illness:    Spencer Best is a 86 y.o. male with a hx of a remote bypass surgery 1994, hypertension hyperlipidemia and previous vasovagal syncope who was last seen 01/04/2019. Compliance with diet, lifestyle and medications: Yes His wife is present she supervises him he has a paucity of communication.  He has  had no chest pain apparent shortness of breath palpitations syncope is supervised and tolerates his medications.  She forgot to bring labs from the [facility] hospital since she brings a normal pulse she told me his lipids were at target his vitamin D level was low.  His EKG in my office today shows sinus rhythm and is normal Past Medical History:  Diagnosis Date  . HEMORRHOIDS, HX OF   . HYPERLIPIDEMIA   . HYPERTENSION   . LABRYNTHITIS   . MYOCARDIAL INFARCTION, INFERIOR WALL, SUBSEQUENT CARE 06/11/2009  . PERCUTANEOUS TRANSLUMINAL CORONARY ANGIOPLASTY, HX OF 08/16/2007  . SYNCOPE, VASOVAGAL     Past Surgical History:  Procedure Laterality Date  . CARDIAC CATHETERIZATION    . CORONARY ANGIOPLASTY    . CORONARY ARTERY BYPASS GRAFT     L internal mammary artery to L anterior, saphenous vein graft [Other]  . correction of abdominal birth defect     Age 4    Current Medications: Current Meds  Medication Sig  . aspirin 81 MG tablet Take 81 mg by mouth daily.  . chlorpheniramine (CHLOR-TRIMETON) 4 MG tablet Take 4 mg by mouth 2 (two) times daily as needed for allergies.  . cyanocobalamin 1000 MCG tablet Take 1,000 mcg by mouth daily.  Marland Kitchen losartan (COZAAR) 50 MG tablet TAKE 1 TABLET BY MOUTH EVERY DAY  .  memantine (NAMENDA) 10 MG tablet Take 1 tablet (10 mg total) by mouth 2 (two) times daily.  . nitroGLYCERIN (NITROSTAT) 0.4 MG SL tablet Place 1 tablet (0.4 mg total) under the tongue every 5 (five) minutes x 3 doses as needed for chest pain.  . Omega-3 Fatty Acids (FISH OIL) 1200 MG CAPS Take 1 capsule by mouth daily.    . rosuvastatin (CRESTOR) 20 MG tablet TAKE 1 TABLET BY MOUTH EVERY DAY  . VITAMIN D PO Take by mouth daily.  . [DISCONTINUED] nitroGLYCERIN (NITROSTAT) 0.4 MG SL tablet Place 1 tablet (0.4 mg total) under the tongue every 5 (five) minutes x 3 doses as needed for chest pain.     Allergies:   Patient has no known allergies.   Social History   Socioeconomic History  . Marital  status: Married    Spouse name: Not on file  . Number of children: 2  . Years of education: 33  . Highest education level: Not on file  Occupational History  . Occupation: Full time on farm  . Occupation: RETIRED    Employer: RETIRED  Tobacco Use  . Smoking status: Former Smoker    Types: Cigarettes, Cigars    Quit date: 06/25/1971    Years since quitting: 48.6  . Smokeless tobacco: Never Used  Substance and Sexual Activity  . Alcohol use: No    Alcohol/week: 0.0 standard drinks  . Drug use: No  . Sexual activity: Not Currently  Other Topics Concern  . Not on file  Social History Narrative   HSG Married 1955. 1 son 58, 1 daughter 55, 3 grandchildren.    Work full time on the farm, Manufacturing engineer. Marriage in good health. ACP - discussed with him and his wife. Referred to [email or web address].  Lives in a one story home.     Education: high school.   Social Determinants of Health   Financial Resource Strain:   . Difficulty of Paying Living Expenses:   Food Insecurity:   . Worried About Charity fundraiser in the Last Year:   . Arboriculturist in the Last Year:   Transportation Needs:   . Film/video editor (Medical):   Marland Kitchen Lack of Transportation (Non-Medical):   Physical Activity:   . Days of Exercise per Week:   . Minutes of Exercise per Session:   Stress:   . Feeling of Stress :   Social Connections:   . Frequency of Communication with Friends and Family:   . Frequency of Social Gatherings with Friends and Family:   . Attends Religious Services:   . Active Member of Clubs or Organizations:   . Attends Archivist Meetings:   Marland Kitchen Marital Status:      Family History: The patient's family history includes Arrhythmia in his mother; Coronary artery disease in his father; Diverticulitis in his mother; Healthy in his daughter and son; Leukemia in his mother; Lung cancer in his brother; Osteoarthritis in his father; Parkinsonism in his father; Rheum  arthritis in his father. There is no history of Colon cancer or Prostate cancer. ROS:   Please see the history of present illness.    All other systems reviewed and are negative.  EKGs/Labs/Other Studies Reviewed:    The following studies were reviewed today:  EKG:  EKG ordered today and personally reviewed.  The ekg ordered today demonstrates sinus rhythm normal  Recent Labs: No results found for requested labs within last 8760 hours.  Recent Lipid Panel  Component Value Date/Time   CHOL 121 12/26/2017 1015   TRIG 146.0 12/26/2017 1015   HDL 44.90 12/26/2017 1015   CHOLHDL 3 12/26/2017 1015   VLDL 29.2 12/26/2017 1015   LDLCALC 47 12/26/2017 1015    Physical Exam:    VS:  BP (!) 132/58 (BP Location: Left Arm, Patient Position: Sitting, Cuff Size: Normal)   Pulse (!) 59   Temp 97.6 F (36.4 C)   Ht 5\' 6"  (1.676 m)   Wt 189 lb 12.8 oz (86.1 kg)   SpO2 96%   BMI 30.63 kg/m     Wt Readings from Last 3 Encounters:  01/29/20 189 lb 12.8 oz (86.1 kg)  12/27/19 185 lb (83.9 kg)  01/04/19 188 lb 12.8 oz (85.6 kg)     GEN:  Well nourished, well developed in no acute distress HEENT: Normal NECK: No JVD; No carotid bruits LYMPHATICS: No lymphadenopathy CARDIAC: RRR, no murmurs, rubs, gallops RESPIRATORY:  Clear to auscultation without rales, wheezing or rhonchi  ABDOMEN: Soft, non-tender, non-distended MUSCULOSKELETAL:  No edema; No deformity  SKIN: Warm and dry NEUROLOGIC:  Alert and oriented x 3 PSYCHIATRIC:  Normal affect    Signed, Shirlee More, MD  01/29/2020 2:09 PM    [place]

## 2020-01-29 NOTE — Patient Instructions (Signed)
Medication Instructions:  Your physician recommends that you continue on your current medications as directed. Please refer to the Current Medication list given to you today.  *If you need a refill on your cardiac medications before your next appointment, please call your pharmacy*   Lab Work: None If you have labs (blood work) drawn today and your tests are completely normal, you will receive your results only by: Marland Kitchen MyChart Message (if you have MyChart) OR . A paper copy in the mail If you have any lab test that is abnormal or we need to change your treatment, we will call you to review the results.   Testing/Procedures: None   Follow-Up: At [facility], you and your health needs are our priority.  As part of our continuing mission to provide you with exceptional heart care, we have created designated Provider Care Teams.  These Care Teams include your primary Cardiologist (physician) and Advanced Practice Providers (APPs -  Physician Assistants and Nurse Practitioners) who all work together to provide you with the care you need, when you need it.  We recommend signing up for the patient portal called "MyChart".  Sign up information is provided on this After Visit Summary.  MyChart is used to connect with patients for Virtual Visits (Telemedicine).  Patients are able to view lab/test results, encounter notes, upcoming appointments, etc.  Non-urgent messages can be sent to your provider as well.   To learn more about what you can do with MyChart, go to [email or web address].    Your next appointment:   1 year(s)  The format for your next appointment:   In Person  Provider:   Dr Bettina Gavia   Other Instructions Please bring your lab work into our office once completed.

## 2020-01-30 ENCOUNTER — Telehealth: Payer: Self-pay | Admitting: Cardiology

## 2020-01-30 NOTE — Telephone Encounter (Signed)
Patient's wife states she would like to know if she may bring a copy of blood work completed by [place] to the [facility] office for Dr. Bettina Gavia as requested. Please advise.

## 2020-01-30 NOTE — Telephone Encounter (Signed)
The patient's wife is coming to the office today with a copy of the patient's labs from the [place].  She would like a copy made and given to Dr Bettina Gavia to review.

## 2020-02-01 DIAGNOSIS — R6889 Other general symptoms and signs: Secondary | ICD-10-CM | POA: Diagnosis not present

## 2020-02-01 DIAGNOSIS — Z79899 Other long term (current) drug therapy: Secondary | ICD-10-CM | POA: Diagnosis not present

## 2020-02-01 DIAGNOSIS — Z6829 Body mass index (BMI) 29.0-29.9, adult: Secondary | ICD-10-CM | POA: Diagnosis not present

## 2020-02-01 DIAGNOSIS — Z0000 Encounter for general adult medical examination without abnormal findings: Secondary | ICD-10-CM | POA: Diagnosis not present

## 2020-02-01 DIAGNOSIS — I2510 Atherosclerotic heart disease of native coronary artery without angina pectoris: Secondary | ICD-10-CM | POA: Diagnosis not present

## 2020-02-01 DIAGNOSIS — R7989 Other specified abnormal findings of blood chemistry: Secondary | ICD-10-CM | POA: Diagnosis not present

## 2020-02-01 DIAGNOSIS — Z1331 Encounter for screening for depression: Secondary | ICD-10-CM | POA: Diagnosis not present

## 2020-02-26 ENCOUNTER — Other Ambulatory Visit: Payer: Self-pay | Admitting: Cardiology

## 2020-05-08 ENCOUNTER — Telehealth: Payer: Self-pay | Admitting: Neurology

## 2020-05-08 NOTE — Telephone Encounter (Signed)
[employer] 05/07/20 @ 4:47pm:  "Caller states her husband has Alzheimer's and it is getting to be more than she can manage. She needs Dr. Posey Pronto to send a letter to his insurance company so she can get help. Caller states she needs someone to sit with him so she can leave the house. Caller states she cannot leave him alone. Caller states she needs someone to call her in the morning."

## 2020-05-08 NOTE — Telephone Encounter (Signed)
Spoke with pts wife who states he is getting weaker and harder to care for. She is needing assistance with respite care, bathing etc and has not had [place] services up until now. After looking through chart it's noted that [facility] was ordered after OV in 12/2019 but order was closed without services started. Because of the requirement for an in-person OV within 90 days to cover [facility] services encouraged wife to reach out to his PCP for an OV which would be quicker than an OV with Dr Posey Pronto. Also inst her that once [facility] ordered she should reach back out to PCP if no one contacts them within a week. Will hav pt put on wait list for OV with Dr Posey Pronto. Wife verbalized undrrstanding and she will contact PCP.

## 2020-05-15 DIAGNOSIS — R69 Illness, unspecified: Secondary | ICD-10-CM | POA: Diagnosis not present

## 2020-05-15 DIAGNOSIS — R296 Repeated falls: Secondary | ICD-10-CM | POA: Diagnosis not present

## 2020-05-15 DIAGNOSIS — R2681 Unsteadiness on feet: Secondary | ICD-10-CM | POA: Diagnosis not present

## 2020-05-15 DIAGNOSIS — Z6829 Body mass index (BMI) 29.0-29.9, adult: Secondary | ICD-10-CM | POA: Diagnosis not present

## 2020-05-17 DIAGNOSIS — I2510 Atherosclerotic heart disease of native coronary artery without angina pectoris: Secondary | ICD-10-CM | POA: Diagnosis not present

## 2020-05-17 DIAGNOSIS — R69 Illness, unspecified: Secondary | ICD-10-CM | POA: Diagnosis not present

## 2020-05-17 DIAGNOSIS — R296 Repeated falls: Secondary | ICD-10-CM | POA: Diagnosis not present

## 2020-05-17 DIAGNOSIS — Z7982 Long term (current) use of aspirin: Secondary | ICD-10-CM | POA: Diagnosis not present

## 2020-05-17 DIAGNOSIS — Z9181 History of falling: Secondary | ICD-10-CM | POA: Diagnosis not present

## 2020-05-17 DIAGNOSIS — Z87891 Personal history of nicotine dependence: Secondary | ICD-10-CM | POA: Diagnosis not present

## 2020-05-20 DIAGNOSIS — Z7982 Long term (current) use of aspirin: Secondary | ICD-10-CM | POA: Diagnosis not present

## 2020-05-20 DIAGNOSIS — I2510 Atherosclerotic heart disease of native coronary artery without angina pectoris: Secondary | ICD-10-CM | POA: Diagnosis not present

## 2020-05-20 DIAGNOSIS — R69 Illness, unspecified: Secondary | ICD-10-CM | POA: Diagnosis not present

## 2020-05-20 DIAGNOSIS — Z9181 History of falling: Secondary | ICD-10-CM | POA: Diagnosis not present

## 2020-05-20 DIAGNOSIS — R296 Repeated falls: Secondary | ICD-10-CM | POA: Diagnosis not present

## 2020-05-20 DIAGNOSIS — Z87891 Personal history of nicotine dependence: Secondary | ICD-10-CM | POA: Diagnosis not present

## 2020-05-21 DIAGNOSIS — R296 Repeated falls: Secondary | ICD-10-CM | POA: Diagnosis not present

## 2020-05-21 DIAGNOSIS — R69 Illness, unspecified: Secondary | ICD-10-CM | POA: Diagnosis not present

## 2020-05-21 DIAGNOSIS — I2510 Atherosclerotic heart disease of native coronary artery without angina pectoris: Secondary | ICD-10-CM | POA: Diagnosis not present

## 2020-05-21 DIAGNOSIS — Z9181 History of falling: Secondary | ICD-10-CM | POA: Diagnosis not present

## 2020-05-21 DIAGNOSIS — Z7982 Long term (current) use of aspirin: Secondary | ICD-10-CM | POA: Diagnosis not present

## 2020-05-21 DIAGNOSIS — Z87891 Personal history of nicotine dependence: Secondary | ICD-10-CM | POA: Diagnosis not present

## 2020-05-23 DIAGNOSIS — Z9181 History of falling: Secondary | ICD-10-CM | POA: Diagnosis not present

## 2020-05-23 DIAGNOSIS — I2510 Atherosclerotic heart disease of native coronary artery without angina pectoris: Secondary | ICD-10-CM | POA: Diagnosis not present

## 2020-05-23 DIAGNOSIS — Z7982 Long term (current) use of aspirin: Secondary | ICD-10-CM | POA: Diagnosis not present

## 2020-05-23 DIAGNOSIS — Z87891 Personal history of nicotine dependence: Secondary | ICD-10-CM | POA: Diagnosis not present

## 2020-05-23 DIAGNOSIS — R69 Illness, unspecified: Secondary | ICD-10-CM | POA: Diagnosis not present

## 2020-05-23 DIAGNOSIS — R296 Repeated falls: Secondary | ICD-10-CM | POA: Diagnosis not present

## 2020-05-26 DIAGNOSIS — R69 Illness, unspecified: Secondary | ICD-10-CM | POA: Diagnosis not present

## 2020-05-27 DIAGNOSIS — R69 Illness, unspecified: Secondary | ICD-10-CM | POA: Diagnosis not present

## 2020-05-27 DIAGNOSIS — Z9181 History of falling: Secondary | ICD-10-CM | POA: Diagnosis not present

## 2020-05-27 DIAGNOSIS — Z7982 Long term (current) use of aspirin: Secondary | ICD-10-CM | POA: Diagnosis not present

## 2020-05-27 DIAGNOSIS — R296 Repeated falls: Secondary | ICD-10-CM | POA: Diagnosis not present

## 2020-05-27 DIAGNOSIS — I2510 Atherosclerotic heart disease of native coronary artery without angina pectoris: Secondary | ICD-10-CM | POA: Diagnosis not present

## 2020-05-27 DIAGNOSIS — Z87891 Personal history of nicotine dependence: Secondary | ICD-10-CM | POA: Diagnosis not present

## 2020-05-28 DIAGNOSIS — R69 Illness, unspecified: Secondary | ICD-10-CM | POA: Diagnosis not present

## 2020-05-28 DIAGNOSIS — Z87891 Personal history of nicotine dependence: Secondary | ICD-10-CM | POA: Diagnosis not present

## 2020-05-28 DIAGNOSIS — R296 Repeated falls: Secondary | ICD-10-CM | POA: Diagnosis not present

## 2020-05-28 DIAGNOSIS — I2510 Atherosclerotic heart disease of native coronary artery without angina pectoris: Secondary | ICD-10-CM | POA: Diagnosis not present

## 2020-05-28 DIAGNOSIS — Z7982 Long term (current) use of aspirin: Secondary | ICD-10-CM | POA: Diagnosis not present

## 2020-05-28 DIAGNOSIS — Z9181 History of falling: Secondary | ICD-10-CM | POA: Diagnosis not present

## 2020-05-30 DIAGNOSIS — Z87891 Personal history of nicotine dependence: Secondary | ICD-10-CM | POA: Diagnosis not present

## 2020-05-30 DIAGNOSIS — Z9181 History of falling: Secondary | ICD-10-CM | POA: Diagnosis not present

## 2020-05-30 DIAGNOSIS — Z7982 Long term (current) use of aspirin: Secondary | ICD-10-CM | POA: Diagnosis not present

## 2020-05-30 DIAGNOSIS — I2510 Atherosclerotic heart disease of native coronary artery without angina pectoris: Secondary | ICD-10-CM | POA: Diagnosis not present

## 2020-05-30 DIAGNOSIS — R69 Illness, unspecified: Secondary | ICD-10-CM | POA: Diagnosis not present

## 2020-05-30 DIAGNOSIS — R296 Repeated falls: Secondary | ICD-10-CM | POA: Diagnosis not present

## 2020-06-03 DIAGNOSIS — Z87891 Personal history of nicotine dependence: Secondary | ICD-10-CM | POA: Diagnosis not present

## 2020-06-03 DIAGNOSIS — I2510 Atherosclerotic heart disease of native coronary artery without angina pectoris: Secondary | ICD-10-CM | POA: Diagnosis not present

## 2020-06-03 DIAGNOSIS — Z7982 Long term (current) use of aspirin: Secondary | ICD-10-CM | POA: Diagnosis not present

## 2020-06-03 DIAGNOSIS — Z9181 History of falling: Secondary | ICD-10-CM | POA: Diagnosis not present

## 2020-06-03 DIAGNOSIS — R69 Illness, unspecified: Secondary | ICD-10-CM | POA: Diagnosis not present

## 2020-06-03 DIAGNOSIS — R296 Repeated falls: Secondary | ICD-10-CM | POA: Diagnosis not present

## 2020-06-04 DIAGNOSIS — Z7982 Long term (current) use of aspirin: Secondary | ICD-10-CM | POA: Diagnosis not present

## 2020-06-04 DIAGNOSIS — I2510 Atherosclerotic heart disease of native coronary artery without angina pectoris: Secondary | ICD-10-CM | POA: Diagnosis not present

## 2020-06-04 DIAGNOSIS — Z9181 History of falling: Secondary | ICD-10-CM | POA: Diagnosis not present

## 2020-06-04 DIAGNOSIS — Z87891 Personal history of nicotine dependence: Secondary | ICD-10-CM | POA: Diagnosis not present

## 2020-06-04 DIAGNOSIS — R296 Repeated falls: Secondary | ICD-10-CM | POA: Diagnosis not present

## 2020-06-04 DIAGNOSIS — R69 Illness, unspecified: Secondary | ICD-10-CM | POA: Diagnosis not present

## 2020-06-06 DIAGNOSIS — Z87891 Personal history of nicotine dependence: Secondary | ICD-10-CM | POA: Diagnosis not present

## 2020-06-06 DIAGNOSIS — Z7982 Long term (current) use of aspirin: Secondary | ICD-10-CM | POA: Diagnosis not present

## 2020-06-06 DIAGNOSIS — R69 Illness, unspecified: Secondary | ICD-10-CM | POA: Diagnosis not present

## 2020-06-06 DIAGNOSIS — R296 Repeated falls: Secondary | ICD-10-CM | POA: Diagnosis not present

## 2020-06-06 DIAGNOSIS — I2510 Atherosclerotic heart disease of native coronary artery without angina pectoris: Secondary | ICD-10-CM | POA: Diagnosis not present

## 2020-06-06 DIAGNOSIS — Z9181 History of falling: Secondary | ICD-10-CM | POA: Diagnosis not present

## 2020-06-10 DIAGNOSIS — Z9181 History of falling: Secondary | ICD-10-CM | POA: Diagnosis not present

## 2020-06-10 DIAGNOSIS — R69 Illness, unspecified: Secondary | ICD-10-CM | POA: Diagnosis not present

## 2020-06-10 DIAGNOSIS — I2510 Atherosclerotic heart disease of native coronary artery without angina pectoris: Secondary | ICD-10-CM | POA: Diagnosis not present

## 2020-06-10 DIAGNOSIS — R296 Repeated falls: Secondary | ICD-10-CM | POA: Diagnosis not present

## 2020-06-10 DIAGNOSIS — Z87891 Personal history of nicotine dependence: Secondary | ICD-10-CM | POA: Diagnosis not present

## 2020-06-10 DIAGNOSIS — Z7982 Long term (current) use of aspirin: Secondary | ICD-10-CM | POA: Diagnosis not present

## 2020-06-12 DIAGNOSIS — R296 Repeated falls: Secondary | ICD-10-CM | POA: Diagnosis not present

## 2020-06-12 DIAGNOSIS — Z9181 History of falling: Secondary | ICD-10-CM | POA: Diagnosis not present

## 2020-06-12 DIAGNOSIS — I2510 Atherosclerotic heart disease of native coronary artery without angina pectoris: Secondary | ICD-10-CM | POA: Diagnosis not present

## 2020-06-12 DIAGNOSIS — Z87891 Personal history of nicotine dependence: Secondary | ICD-10-CM | POA: Diagnosis not present

## 2020-06-12 DIAGNOSIS — R69 Illness, unspecified: Secondary | ICD-10-CM | POA: Diagnosis not present

## 2020-06-12 DIAGNOSIS — Z7982 Long term (current) use of aspirin: Secondary | ICD-10-CM | POA: Diagnosis not present

## 2020-06-13 DIAGNOSIS — Z87891 Personal history of nicotine dependence: Secondary | ICD-10-CM | POA: Diagnosis not present

## 2020-06-13 DIAGNOSIS — R296 Repeated falls: Secondary | ICD-10-CM | POA: Diagnosis not present

## 2020-06-13 DIAGNOSIS — Z7982 Long term (current) use of aspirin: Secondary | ICD-10-CM | POA: Diagnosis not present

## 2020-06-13 DIAGNOSIS — R69 Illness, unspecified: Secondary | ICD-10-CM | POA: Diagnosis not present

## 2020-06-13 DIAGNOSIS — Z9181 History of falling: Secondary | ICD-10-CM | POA: Diagnosis not present

## 2020-06-13 DIAGNOSIS — I2510 Atherosclerotic heart disease of native coronary artery without angina pectoris: Secondary | ICD-10-CM | POA: Diagnosis not present

## 2020-06-17 DIAGNOSIS — R69 Illness, unspecified: Secondary | ICD-10-CM | POA: Diagnosis not present

## 2020-06-17 DIAGNOSIS — Z87891 Personal history of nicotine dependence: Secondary | ICD-10-CM | POA: Diagnosis not present

## 2020-06-17 DIAGNOSIS — I2510 Atherosclerotic heart disease of native coronary artery without angina pectoris: Secondary | ICD-10-CM | POA: Diagnosis not present

## 2020-06-17 DIAGNOSIS — Z9181 History of falling: Secondary | ICD-10-CM | POA: Diagnosis not present

## 2020-06-17 DIAGNOSIS — Z7982 Long term (current) use of aspirin: Secondary | ICD-10-CM | POA: Diagnosis not present

## 2020-06-17 DIAGNOSIS — R296 Repeated falls: Secondary | ICD-10-CM | POA: Diagnosis not present

## 2020-06-19 DIAGNOSIS — Z9181 History of falling: Secondary | ICD-10-CM | POA: Diagnosis not present

## 2020-06-19 DIAGNOSIS — Z7982 Long term (current) use of aspirin: Secondary | ICD-10-CM | POA: Diagnosis not present

## 2020-06-19 DIAGNOSIS — I2510 Atherosclerotic heart disease of native coronary artery without angina pectoris: Secondary | ICD-10-CM | POA: Diagnosis not present

## 2020-06-19 DIAGNOSIS — R69 Illness, unspecified: Secondary | ICD-10-CM | POA: Diagnosis not present

## 2020-06-19 DIAGNOSIS — R296 Repeated falls: Secondary | ICD-10-CM | POA: Diagnosis not present

## 2020-06-19 DIAGNOSIS — Z87891 Personal history of nicotine dependence: Secondary | ICD-10-CM | POA: Diagnosis not present

## 2020-06-23 DIAGNOSIS — Z87891 Personal history of nicotine dependence: Secondary | ICD-10-CM | POA: Diagnosis not present

## 2020-06-23 DIAGNOSIS — Z7982 Long term (current) use of aspirin: Secondary | ICD-10-CM | POA: Diagnosis not present

## 2020-06-23 DIAGNOSIS — R69 Illness, unspecified: Secondary | ICD-10-CM | POA: Diagnosis not present

## 2020-06-23 DIAGNOSIS — R296 Repeated falls: Secondary | ICD-10-CM | POA: Diagnosis not present

## 2020-06-23 DIAGNOSIS — Z9181 History of falling: Secondary | ICD-10-CM | POA: Diagnosis not present

## 2020-06-23 DIAGNOSIS — I2510 Atherosclerotic heart disease of native coronary artery without angina pectoris: Secondary | ICD-10-CM | POA: Diagnosis not present

## 2020-06-25 DIAGNOSIS — H2513 Age-related nuclear cataract, bilateral: Secondary | ICD-10-CM | POA: Diagnosis not present

## 2020-06-26 DIAGNOSIS — I2510 Atherosclerotic heart disease of native coronary artery without angina pectoris: Secondary | ICD-10-CM | POA: Diagnosis not present

## 2020-06-26 DIAGNOSIS — Z9181 History of falling: Secondary | ICD-10-CM | POA: Diagnosis not present

## 2020-06-26 DIAGNOSIS — R296 Repeated falls: Secondary | ICD-10-CM | POA: Diagnosis not present

## 2020-06-26 DIAGNOSIS — Z7982 Long term (current) use of aspirin: Secondary | ICD-10-CM | POA: Diagnosis not present

## 2020-06-26 DIAGNOSIS — R69 Illness, unspecified: Secondary | ICD-10-CM | POA: Diagnosis not present

## 2020-06-26 DIAGNOSIS — Z87891 Personal history of nicotine dependence: Secondary | ICD-10-CM | POA: Diagnosis not present

## 2020-06-30 DIAGNOSIS — I2510 Atherosclerotic heart disease of native coronary artery without angina pectoris: Secondary | ICD-10-CM | POA: Diagnosis not present

## 2020-06-30 DIAGNOSIS — R296 Repeated falls: Secondary | ICD-10-CM | POA: Diagnosis not present

## 2020-06-30 DIAGNOSIS — Z7982 Long term (current) use of aspirin: Secondary | ICD-10-CM | POA: Diagnosis not present

## 2020-06-30 DIAGNOSIS — R69 Illness, unspecified: Secondary | ICD-10-CM | POA: Diagnosis not present

## 2020-06-30 DIAGNOSIS — Z87891 Personal history of nicotine dependence: Secondary | ICD-10-CM | POA: Diagnosis not present

## 2020-06-30 DIAGNOSIS — Z9181 History of falling: Secondary | ICD-10-CM | POA: Diagnosis not present

## 2020-07-03 DIAGNOSIS — Z9181 History of falling: Secondary | ICD-10-CM | POA: Diagnosis not present

## 2020-07-03 DIAGNOSIS — R296 Repeated falls: Secondary | ICD-10-CM | POA: Diagnosis not present

## 2020-07-03 DIAGNOSIS — I2510 Atherosclerotic heart disease of native coronary artery without angina pectoris: Secondary | ICD-10-CM | POA: Diagnosis not present

## 2020-07-03 DIAGNOSIS — Z7982 Long term (current) use of aspirin: Secondary | ICD-10-CM | POA: Diagnosis not present

## 2020-07-03 DIAGNOSIS — Z87891 Personal history of nicotine dependence: Secondary | ICD-10-CM | POA: Diagnosis not present

## 2020-07-03 DIAGNOSIS — R69 Illness, unspecified: Secondary | ICD-10-CM | POA: Diagnosis not present

## 2020-07-10 DIAGNOSIS — I2510 Atherosclerotic heart disease of native coronary artery without angina pectoris: Secondary | ICD-10-CM | POA: Diagnosis not present

## 2020-07-10 DIAGNOSIS — Z7982 Long term (current) use of aspirin: Secondary | ICD-10-CM | POA: Diagnosis not present

## 2020-07-10 DIAGNOSIS — Z87891 Personal history of nicotine dependence: Secondary | ICD-10-CM | POA: Diagnosis not present

## 2020-07-10 DIAGNOSIS — Z9181 History of falling: Secondary | ICD-10-CM | POA: Diagnosis not present

## 2020-07-10 DIAGNOSIS — R69 Illness, unspecified: Secondary | ICD-10-CM | POA: Diagnosis not present

## 2020-07-10 DIAGNOSIS — R296 Repeated falls: Secondary | ICD-10-CM | POA: Diagnosis not present

## 2020-07-15 DIAGNOSIS — Z9181 History of falling: Secondary | ICD-10-CM | POA: Diagnosis not present

## 2020-07-15 DIAGNOSIS — Z7982 Long term (current) use of aspirin: Secondary | ICD-10-CM | POA: Diagnosis not present

## 2020-07-15 DIAGNOSIS — I2510 Atherosclerotic heart disease of native coronary artery without angina pectoris: Secondary | ICD-10-CM | POA: Diagnosis not present

## 2020-07-15 DIAGNOSIS — R296 Repeated falls: Secondary | ICD-10-CM | POA: Diagnosis not present

## 2020-07-15 DIAGNOSIS — R69 Illness, unspecified: Secondary | ICD-10-CM | POA: Diagnosis not present

## 2020-07-15 DIAGNOSIS — Z87891 Personal history of nicotine dependence: Secondary | ICD-10-CM | POA: Diagnosis not present

## 2020-07-16 DIAGNOSIS — R2681 Unsteadiness on feet: Secondary | ICD-10-CM | POA: Diagnosis not present

## 2020-07-16 DIAGNOSIS — Z6829 Body mass index (BMI) 29.0-29.9, adult: Secondary | ICD-10-CM | POA: Diagnosis not present

## 2020-07-16 DIAGNOSIS — R69 Illness, unspecified: Secondary | ICD-10-CM | POA: Diagnosis not present

## 2020-07-16 DIAGNOSIS — Z111 Encounter for screening for respiratory tuberculosis: Secondary | ICD-10-CM | POA: Diagnosis not present

## 2020-07-16 DIAGNOSIS — Z23 Encounter for immunization: Secondary | ICD-10-CM | POA: Diagnosis not present

## 2020-07-19 DIAGNOSIS — Z043 Encounter for examination and observation following other accident: Secondary | ICD-10-CM | POA: Diagnosis not present

## 2020-07-19 DIAGNOSIS — R0902 Hypoxemia: Secondary | ICD-10-CM | POA: Diagnosis not present

## 2020-07-19 DIAGNOSIS — R040 Epistaxis: Secondary | ICD-10-CM | POA: Diagnosis not present

## 2020-07-19 DIAGNOSIS — I10 Essential (primary) hypertension: Secondary | ICD-10-CM | POA: Diagnosis not present

## 2020-07-19 DIAGNOSIS — R58 Hemorrhage, not elsewhere classified: Secondary | ICD-10-CM | POA: Diagnosis not present

## 2020-07-19 DIAGNOSIS — S0990XA Unspecified injury of head, initial encounter: Secondary | ICD-10-CM | POA: Diagnosis not present

## 2020-07-19 DIAGNOSIS — S0031XA Abrasion of nose, initial encounter: Secondary | ICD-10-CM | POA: Diagnosis not present

## 2020-07-19 DIAGNOSIS — W19XXXA Unspecified fall, initial encounter: Secondary | ICD-10-CM | POA: Diagnosis not present

## 2020-07-23 DIAGNOSIS — E782 Mixed hyperlipidemia: Secondary | ICD-10-CM | POA: Diagnosis not present

## 2020-07-23 DIAGNOSIS — E559 Vitamin D deficiency, unspecified: Secondary | ICD-10-CM | POA: Diagnosis not present

## 2020-07-23 DIAGNOSIS — R69 Illness, unspecified: Secondary | ICD-10-CM | POA: Diagnosis not present

## 2020-07-23 DIAGNOSIS — R41841 Cognitive communication deficit: Secondary | ICD-10-CM | POA: Diagnosis not present

## 2020-07-24 DIAGNOSIS — R296 Repeated falls: Secondary | ICD-10-CM | POA: Diagnosis not present

## 2020-07-24 DIAGNOSIS — R69 Illness, unspecified: Secondary | ICD-10-CM | POA: Diagnosis not present

## 2020-07-24 DIAGNOSIS — Z7982 Long term (current) use of aspirin: Secondary | ICD-10-CM | POA: Diagnosis not present

## 2020-07-24 DIAGNOSIS — Z87891 Personal history of nicotine dependence: Secondary | ICD-10-CM | POA: Diagnosis not present

## 2020-07-24 DIAGNOSIS — Z9181 History of falling: Secondary | ICD-10-CM | POA: Diagnosis not present

## 2020-07-24 DIAGNOSIS — I2510 Atherosclerotic heart disease of native coronary artery without angina pectoris: Secondary | ICD-10-CM | POA: Diagnosis not present

## 2020-07-25 ENCOUNTER — Other Ambulatory Visit: Payer: Self-pay | Admitting: Cardiology

## 2020-07-28 DIAGNOSIS — E038 Other specified hypothyroidism: Secondary | ICD-10-CM | POA: Diagnosis not present

## 2020-07-28 DIAGNOSIS — E559 Vitamin D deficiency, unspecified: Secondary | ICD-10-CM | POA: Diagnosis not present

## 2020-07-28 DIAGNOSIS — E119 Type 2 diabetes mellitus without complications: Secondary | ICD-10-CM | POA: Diagnosis not present

## 2020-07-28 DIAGNOSIS — Z79899 Other long term (current) drug therapy: Secondary | ICD-10-CM | POA: Diagnosis not present

## 2020-07-28 DIAGNOSIS — E7849 Other hyperlipidemia: Secondary | ICD-10-CM | POA: Diagnosis not present

## 2020-07-28 DIAGNOSIS — D518 Other vitamin B12 deficiency anemias: Secondary | ICD-10-CM | POA: Diagnosis not present

## 2020-07-30 DIAGNOSIS — R296 Repeated falls: Secondary | ICD-10-CM | POA: Diagnosis not present

## 2020-07-30 DIAGNOSIS — Z9181 History of falling: Secondary | ICD-10-CM | POA: Diagnosis not present

## 2020-07-30 DIAGNOSIS — I2510 Atherosclerotic heart disease of native coronary artery without angina pectoris: Secondary | ICD-10-CM | POA: Diagnosis not present

## 2020-07-30 DIAGNOSIS — R69 Illness, unspecified: Secondary | ICD-10-CM | POA: Diagnosis not present

## 2020-07-30 DIAGNOSIS — Z7982 Long term (current) use of aspirin: Secondary | ICD-10-CM | POA: Diagnosis not present

## 2020-07-30 DIAGNOSIS — Z87891 Personal history of nicotine dependence: Secondary | ICD-10-CM | POA: Diagnosis not present

## 2020-08-06 DIAGNOSIS — Z87891 Personal history of nicotine dependence: Secondary | ICD-10-CM | POA: Diagnosis not present

## 2020-08-06 DIAGNOSIS — Z7982 Long term (current) use of aspirin: Secondary | ICD-10-CM | POA: Diagnosis not present

## 2020-08-06 DIAGNOSIS — I2510 Atherosclerotic heart disease of native coronary artery without angina pectoris: Secondary | ICD-10-CM | POA: Diagnosis not present

## 2020-08-06 DIAGNOSIS — R296 Repeated falls: Secondary | ICD-10-CM | POA: Diagnosis not present

## 2020-08-06 DIAGNOSIS — Z9181 History of falling: Secondary | ICD-10-CM | POA: Diagnosis not present

## 2020-08-06 DIAGNOSIS — R69 Illness, unspecified: Secondary | ICD-10-CM | POA: Diagnosis not present

## 2020-08-07 DIAGNOSIS — R69 Illness, unspecified: Secondary | ICD-10-CM | POA: Diagnosis not present

## 2020-08-07 DIAGNOSIS — F0391 Unspecified dementia with behavioral disturbance: Secondary | ICD-10-CM | POA: Diagnosis not present

## 2020-08-11 DIAGNOSIS — Z7982 Long term (current) use of aspirin: Secondary | ICD-10-CM | POA: Diagnosis not present

## 2020-08-11 DIAGNOSIS — I2510 Atherosclerotic heart disease of native coronary artery without angina pectoris: Secondary | ICD-10-CM | POA: Diagnosis not present

## 2020-08-11 DIAGNOSIS — Z9181 History of falling: Secondary | ICD-10-CM | POA: Diagnosis not present

## 2020-08-11 DIAGNOSIS — Z87891 Personal history of nicotine dependence: Secondary | ICD-10-CM | POA: Diagnosis not present

## 2020-08-11 DIAGNOSIS — R296 Repeated falls: Secondary | ICD-10-CM | POA: Diagnosis not present

## 2020-08-11 DIAGNOSIS — R69 Illness, unspecified: Secondary | ICD-10-CM | POA: Diagnosis not present

## 2020-08-21 DIAGNOSIS — I10 Essential (primary) hypertension: Secondary | ICD-10-CM | POA: Diagnosis not present

## 2020-08-21 DIAGNOSIS — R600 Localized edema: Secondary | ICD-10-CM | POA: Diagnosis not present

## 2020-09-03 DIAGNOSIS — I10 Essential (primary) hypertension: Secondary | ICD-10-CM | POA: Diagnosis not present

## 2020-09-03 DIAGNOSIS — E039 Hypothyroidism, unspecified: Secondary | ICD-10-CM | POA: Diagnosis not present

## 2020-09-03 DIAGNOSIS — I739 Peripheral vascular disease, unspecified: Secondary | ICD-10-CM | POA: Diagnosis not present

## 2020-09-03 DIAGNOSIS — R69 Illness, unspecified: Secondary | ICD-10-CM | POA: Diagnosis not present

## 2020-09-05 DIAGNOSIS — R69 Illness, unspecified: Secondary | ICD-10-CM | POA: Diagnosis not present

## 2020-10-03 DIAGNOSIS — R69 Illness, unspecified: Secondary | ICD-10-CM | POA: Diagnosis not present

## 2020-10-08 DIAGNOSIS — E039 Hypothyroidism, unspecified: Secondary | ICD-10-CM | POA: Diagnosis not present

## 2020-10-08 DIAGNOSIS — I739 Peripheral vascular disease, unspecified: Secondary | ICD-10-CM | POA: Diagnosis not present

## 2020-10-08 DIAGNOSIS — R69 Illness, unspecified: Secondary | ICD-10-CM | POA: Diagnosis not present

## 2020-10-08 DIAGNOSIS — I10 Essential (primary) hypertension: Secondary | ICD-10-CM | POA: Diagnosis not present

## 2020-10-14 DIAGNOSIS — L609 Nail disorder, unspecified: Secondary | ICD-10-CM | POA: Diagnosis not present

## 2020-10-14 DIAGNOSIS — B351 Tinea unguium: Secondary | ICD-10-CM | POA: Diagnosis not present

## 2020-10-29 DIAGNOSIS — I10 Essential (primary) hypertension: Secondary | ICD-10-CM | POA: Diagnosis not present

## 2020-10-29 DIAGNOSIS — I25119 Atherosclerotic heart disease of native coronary artery with unspecified angina pectoris: Secondary | ICD-10-CM | POA: Diagnosis not present

## 2020-10-29 DIAGNOSIS — E785 Hyperlipidemia, unspecified: Secondary | ICD-10-CM | POA: Diagnosis not present

## 2020-10-31 DIAGNOSIS — R69 Illness, unspecified: Secondary | ICD-10-CM | POA: Diagnosis not present

## 2020-11-27 DIAGNOSIS — I10 Essential (primary) hypertension: Secondary | ICD-10-CM | POA: Diagnosis not present

## 2020-11-27 DIAGNOSIS — E785 Hyperlipidemia, unspecified: Secondary | ICD-10-CM | POA: Diagnosis not present

## 2020-11-27 DIAGNOSIS — R69 Illness, unspecified: Secondary | ICD-10-CM | POA: Diagnosis not present

## 2020-11-27 DIAGNOSIS — E039 Hypothyroidism, unspecified: Secondary | ICD-10-CM | POA: Diagnosis not present

## 2020-11-28 DIAGNOSIS — R69 Illness, unspecified: Secondary | ICD-10-CM | POA: Diagnosis not present

## 2020-12-04 DIAGNOSIS — G301 Alzheimer's disease with late onset: Secondary | ICD-10-CM | POA: Diagnosis not present

## 2020-12-04 DIAGNOSIS — E782 Mixed hyperlipidemia: Secondary | ICD-10-CM | POA: Diagnosis not present

## 2020-12-04 DIAGNOSIS — U071 COVID-19: Secondary | ICD-10-CM | POA: Diagnosis not present

## 2020-12-04 DIAGNOSIS — R0981 Nasal congestion: Secondary | ICD-10-CM | POA: Diagnosis not present

## 2020-12-17 DIAGNOSIS — I2510 Atherosclerotic heart disease of native coronary artery without angina pectoris: Secondary | ICD-10-CM | POA: Diagnosis not present

## 2020-12-17 DIAGNOSIS — I10 Essential (primary) hypertension: Secondary | ICD-10-CM | POA: Diagnosis not present

## 2020-12-17 DIAGNOSIS — M6281 Muscle weakness (generalized): Secondary | ICD-10-CM | POA: Diagnosis not present

## 2020-12-17 DIAGNOSIS — R69 Illness, unspecified: Secondary | ICD-10-CM | POA: Diagnosis not present

## 2020-12-18 DIAGNOSIS — Z951 Presence of aortocoronary bypass graft: Secondary | ICD-10-CM | POA: Diagnosis not present

## 2020-12-18 DIAGNOSIS — Z9181 History of falling: Secondary | ICD-10-CM | POA: Diagnosis not present

## 2020-12-18 DIAGNOSIS — I2510 Atherosclerotic heart disease of native coronary artery without angina pectoris: Secondary | ICD-10-CM | POA: Diagnosis not present

## 2020-12-18 DIAGNOSIS — Z7982 Long term (current) use of aspirin: Secondary | ICD-10-CM | POA: Diagnosis not present

## 2020-12-18 DIAGNOSIS — G301 Alzheimer's disease with late onset: Secondary | ICD-10-CM | POA: Diagnosis not present

## 2020-12-18 DIAGNOSIS — E559 Vitamin D deficiency, unspecified: Secondary | ICD-10-CM | POA: Diagnosis not present

## 2020-12-18 DIAGNOSIS — E785 Hyperlipidemia, unspecified: Secondary | ICD-10-CM | POA: Diagnosis not present

## 2020-12-18 DIAGNOSIS — R600 Localized edema: Secondary | ICD-10-CM | POA: Diagnosis not present

## 2020-12-18 DIAGNOSIS — E039 Hypothyroidism, unspecified: Secondary | ICD-10-CM | POA: Diagnosis not present

## 2020-12-18 DIAGNOSIS — R69 Illness, unspecified: Secondary | ICD-10-CM | POA: Diagnosis not present

## 2020-12-19 DIAGNOSIS — M6281 Muscle weakness (generalized): Secondary | ICD-10-CM | POA: Diagnosis not present

## 2020-12-19 DIAGNOSIS — R269 Unspecified abnormalities of gait and mobility: Secondary | ICD-10-CM | POA: Diagnosis not present

## 2020-12-19 DIAGNOSIS — R600 Localized edema: Secondary | ICD-10-CM | POA: Diagnosis not present

## 2020-12-19 DIAGNOSIS — R053 Chronic cough: Secondary | ICD-10-CM | POA: Diagnosis not present

## 2020-12-23 DIAGNOSIS — E559 Vitamin D deficiency, unspecified: Secondary | ICD-10-CM | POA: Diagnosis not present

## 2020-12-23 DIAGNOSIS — E039 Hypothyroidism, unspecified: Secondary | ICD-10-CM | POA: Diagnosis not present

## 2020-12-23 DIAGNOSIS — R600 Localized edema: Secondary | ICD-10-CM | POA: Diagnosis not present

## 2020-12-23 DIAGNOSIS — Z9181 History of falling: Secondary | ICD-10-CM | POA: Diagnosis not present

## 2020-12-23 DIAGNOSIS — Z951 Presence of aortocoronary bypass graft: Secondary | ICD-10-CM | POA: Diagnosis not present

## 2020-12-23 DIAGNOSIS — Z7982 Long term (current) use of aspirin: Secondary | ICD-10-CM | POA: Diagnosis not present

## 2020-12-23 DIAGNOSIS — E785 Hyperlipidemia, unspecified: Secondary | ICD-10-CM | POA: Diagnosis not present

## 2020-12-23 DIAGNOSIS — R69 Illness, unspecified: Secondary | ICD-10-CM | POA: Diagnosis not present

## 2020-12-23 DIAGNOSIS — I2510 Atherosclerotic heart disease of native coronary artery without angina pectoris: Secondary | ICD-10-CM | POA: Diagnosis not present

## 2020-12-23 DIAGNOSIS — G301 Alzheimer's disease with late onset: Secondary | ICD-10-CM | POA: Diagnosis not present

## 2020-12-24 DIAGNOSIS — I10 Essential (primary) hypertension: Secondary | ICD-10-CM | POA: Diagnosis not present

## 2020-12-24 DIAGNOSIS — E039 Hypothyroidism, unspecified: Secondary | ICD-10-CM | POA: Diagnosis not present

## 2020-12-24 DIAGNOSIS — E785 Hyperlipidemia, unspecified: Secondary | ICD-10-CM | POA: Diagnosis not present

## 2020-12-24 DIAGNOSIS — D649 Anemia, unspecified: Secondary | ICD-10-CM | POA: Diagnosis not present

## 2020-12-24 DIAGNOSIS — Z79899 Other long term (current) drug therapy: Secondary | ICD-10-CM | POA: Diagnosis not present

## 2020-12-24 DIAGNOSIS — E119 Type 2 diabetes mellitus without complications: Secondary | ICD-10-CM | POA: Diagnosis not present

## 2020-12-24 DIAGNOSIS — E559 Vitamin D deficiency, unspecified: Secondary | ICD-10-CM | POA: Diagnosis not present

## 2020-12-24 DIAGNOSIS — I509 Heart failure, unspecified: Secondary | ICD-10-CM | POA: Diagnosis not present

## 2020-12-25 DIAGNOSIS — E559 Vitamin D deficiency, unspecified: Secondary | ICD-10-CM | POA: Diagnosis not present

## 2020-12-25 DIAGNOSIS — Z9181 History of falling: Secondary | ICD-10-CM | POA: Diagnosis not present

## 2020-12-25 DIAGNOSIS — D696 Thrombocytopenia, unspecified: Secondary | ICD-10-CM | POA: Diagnosis not present

## 2020-12-25 DIAGNOSIS — R69 Illness, unspecified: Secondary | ICD-10-CM | POA: Diagnosis not present

## 2020-12-25 DIAGNOSIS — R600 Localized edema: Secondary | ICD-10-CM | POA: Diagnosis not present

## 2020-12-25 DIAGNOSIS — E039 Hypothyroidism, unspecified: Secondary | ICD-10-CM | POA: Diagnosis not present

## 2020-12-25 DIAGNOSIS — E876 Hypokalemia: Secondary | ICD-10-CM | POA: Diagnosis not present

## 2020-12-25 DIAGNOSIS — Z7982 Long term (current) use of aspirin: Secondary | ICD-10-CM | POA: Diagnosis not present

## 2020-12-25 DIAGNOSIS — Z951 Presence of aortocoronary bypass graft: Secondary | ICD-10-CM | POA: Diagnosis not present

## 2020-12-25 DIAGNOSIS — R799 Abnormal finding of blood chemistry, unspecified: Secondary | ICD-10-CM | POA: Diagnosis not present

## 2020-12-25 DIAGNOSIS — G301 Alzheimer's disease with late onset: Secondary | ICD-10-CM | POA: Diagnosis not present

## 2020-12-25 DIAGNOSIS — R770 Abnormality of albumin: Secondary | ICD-10-CM | POA: Diagnosis not present

## 2020-12-25 DIAGNOSIS — E785 Hyperlipidemia, unspecified: Secondary | ICD-10-CM | POA: Diagnosis not present

## 2020-12-25 DIAGNOSIS — I2510 Atherosclerotic heart disease of native coronary artery without angina pectoris: Secondary | ICD-10-CM | POA: Diagnosis not present

## 2020-12-29 ENCOUNTER — Ambulatory Visit: Payer: Medicare HMO | Admitting: Neurology

## 2020-12-31 DIAGNOSIS — E785 Hyperlipidemia, unspecified: Secondary | ICD-10-CM | POA: Diagnosis not present

## 2020-12-31 DIAGNOSIS — R69 Illness, unspecified: Secondary | ICD-10-CM | POA: Diagnosis not present

## 2020-12-31 DIAGNOSIS — R600 Localized edema: Secondary | ICD-10-CM | POA: Diagnosis not present

## 2020-12-31 DIAGNOSIS — Z951 Presence of aortocoronary bypass graft: Secondary | ICD-10-CM | POA: Diagnosis not present

## 2020-12-31 DIAGNOSIS — Z7982 Long term (current) use of aspirin: Secondary | ICD-10-CM | POA: Diagnosis not present

## 2020-12-31 DIAGNOSIS — G301 Alzheimer's disease with late onset: Secondary | ICD-10-CM | POA: Diagnosis not present

## 2020-12-31 DIAGNOSIS — E559 Vitamin D deficiency, unspecified: Secondary | ICD-10-CM | POA: Diagnosis not present

## 2020-12-31 DIAGNOSIS — E039 Hypothyroidism, unspecified: Secondary | ICD-10-CM | POA: Diagnosis not present

## 2020-12-31 DIAGNOSIS — Z9181 History of falling: Secondary | ICD-10-CM | POA: Diagnosis not present

## 2020-12-31 DIAGNOSIS — I2510 Atherosclerotic heart disease of native coronary artery without angina pectoris: Secondary | ICD-10-CM | POA: Diagnosis not present

## 2021-01-02 DIAGNOSIS — E039 Hypothyroidism, unspecified: Secondary | ICD-10-CM | POA: Diagnosis not present

## 2021-01-02 DIAGNOSIS — R600 Localized edema: Secondary | ICD-10-CM | POA: Diagnosis not present

## 2021-01-02 DIAGNOSIS — E559 Vitamin D deficiency, unspecified: Secondary | ICD-10-CM | POA: Diagnosis not present

## 2021-01-02 DIAGNOSIS — E785 Hyperlipidemia, unspecified: Secondary | ICD-10-CM | POA: Diagnosis not present

## 2021-01-02 DIAGNOSIS — Z9181 History of falling: Secondary | ICD-10-CM | POA: Diagnosis not present

## 2021-01-02 DIAGNOSIS — R69 Illness, unspecified: Secondary | ICD-10-CM | POA: Diagnosis not present

## 2021-01-02 DIAGNOSIS — G301 Alzheimer's disease with late onset: Secondary | ICD-10-CM | POA: Diagnosis not present

## 2021-01-02 DIAGNOSIS — I2510 Atherosclerotic heart disease of native coronary artery without angina pectoris: Secondary | ICD-10-CM | POA: Diagnosis not present

## 2021-01-02 DIAGNOSIS — Z951 Presence of aortocoronary bypass graft: Secondary | ICD-10-CM | POA: Diagnosis not present

## 2021-01-02 DIAGNOSIS — Z7982 Long term (current) use of aspirin: Secondary | ICD-10-CM | POA: Diagnosis not present

## 2021-01-07 DIAGNOSIS — E039 Hypothyroidism, unspecified: Secondary | ICD-10-CM | POA: Diagnosis not present

## 2021-01-07 DIAGNOSIS — G301 Alzheimer's disease with late onset: Secondary | ICD-10-CM | POA: Diagnosis not present

## 2021-01-07 DIAGNOSIS — E559 Vitamin D deficiency, unspecified: Secondary | ICD-10-CM | POA: Diagnosis not present

## 2021-01-07 DIAGNOSIS — Z7982 Long term (current) use of aspirin: Secondary | ICD-10-CM | POA: Diagnosis not present

## 2021-01-07 DIAGNOSIS — R600 Localized edema: Secondary | ICD-10-CM | POA: Diagnosis not present

## 2021-01-07 DIAGNOSIS — I2510 Atherosclerotic heart disease of native coronary artery without angina pectoris: Secondary | ICD-10-CM | POA: Diagnosis not present

## 2021-01-07 DIAGNOSIS — R69 Illness, unspecified: Secondary | ICD-10-CM | POA: Diagnosis not present

## 2021-01-07 DIAGNOSIS — E785 Hyperlipidemia, unspecified: Secondary | ICD-10-CM | POA: Diagnosis not present

## 2021-01-07 DIAGNOSIS — Z951 Presence of aortocoronary bypass graft: Secondary | ICD-10-CM | POA: Diagnosis not present

## 2021-01-07 DIAGNOSIS — Z9181 History of falling: Secondary | ICD-10-CM | POA: Diagnosis not present

## 2021-01-09 DIAGNOSIS — R296 Repeated falls: Secondary | ICD-10-CM | POA: Diagnosis not present

## 2021-01-09 DIAGNOSIS — R269 Unspecified abnormalities of gait and mobility: Secondary | ICD-10-CM | POA: Diagnosis not present

## 2021-01-09 DIAGNOSIS — R54 Age-related physical debility: Secondary | ICD-10-CM | POA: Diagnosis not present

## 2021-01-09 DIAGNOSIS — M6281 Muscle weakness (generalized): Secondary | ICD-10-CM | POA: Diagnosis not present

## 2021-01-13 DIAGNOSIS — R69 Illness, unspecified: Secondary | ICD-10-CM | POA: Diagnosis not present

## 2021-01-13 DIAGNOSIS — Z7982 Long term (current) use of aspirin: Secondary | ICD-10-CM | POA: Diagnosis not present

## 2021-01-13 DIAGNOSIS — I2510 Atherosclerotic heart disease of native coronary artery without angina pectoris: Secondary | ICD-10-CM | POA: Diagnosis not present

## 2021-01-13 DIAGNOSIS — R600 Localized edema: Secondary | ICD-10-CM | POA: Diagnosis not present

## 2021-01-13 DIAGNOSIS — Z9181 History of falling: Secondary | ICD-10-CM | POA: Diagnosis not present

## 2021-01-13 DIAGNOSIS — E785 Hyperlipidemia, unspecified: Secondary | ICD-10-CM | POA: Diagnosis not present

## 2021-01-13 DIAGNOSIS — G301 Alzheimer's disease with late onset: Secondary | ICD-10-CM | POA: Diagnosis not present

## 2021-01-13 DIAGNOSIS — E039 Hypothyroidism, unspecified: Secondary | ICD-10-CM | POA: Diagnosis not present

## 2021-01-13 DIAGNOSIS — E559 Vitamin D deficiency, unspecified: Secondary | ICD-10-CM | POA: Diagnosis not present

## 2021-01-13 DIAGNOSIS — Z951 Presence of aortocoronary bypass graft: Secondary | ICD-10-CM | POA: Diagnosis not present

## 2021-01-14 DIAGNOSIS — R269 Unspecified abnormalities of gait and mobility: Secondary | ICD-10-CM | POA: Diagnosis not present

## 2021-01-14 DIAGNOSIS — M6281 Muscle weakness (generalized): Secondary | ICD-10-CM | POA: Diagnosis not present

## 2021-01-14 DIAGNOSIS — S51012A Laceration without foreign body of left elbow, initial encounter: Secondary | ICD-10-CM | POA: Diagnosis not present

## 2021-01-14 DIAGNOSIS — R296 Repeated falls: Secondary | ICD-10-CM | POA: Diagnosis not present

## 2021-01-19 DIAGNOSIS — R634 Abnormal weight loss: Secondary | ICD-10-CM | POA: Diagnosis not present

## 2021-01-19 DIAGNOSIS — M6281 Muscle weakness (generalized): Secondary | ICD-10-CM | POA: Diagnosis not present

## 2021-01-19 DIAGNOSIS — R69 Illness, unspecified: Secondary | ICD-10-CM | POA: Diagnosis not present

## 2021-01-19 DIAGNOSIS — R269 Unspecified abnormalities of gait and mobility: Secondary | ICD-10-CM | POA: Diagnosis not present

## 2021-01-21 DIAGNOSIS — E559 Vitamin D deficiency, unspecified: Secondary | ICD-10-CM | POA: Diagnosis not present

## 2021-01-21 DIAGNOSIS — R69 Illness, unspecified: Secondary | ICD-10-CM | POA: Diagnosis not present

## 2021-01-21 DIAGNOSIS — Z951 Presence of aortocoronary bypass graft: Secondary | ICD-10-CM | POA: Diagnosis not present

## 2021-01-21 DIAGNOSIS — E785 Hyperlipidemia, unspecified: Secondary | ICD-10-CM | POA: Diagnosis not present

## 2021-01-21 DIAGNOSIS — E039 Hypothyroidism, unspecified: Secondary | ICD-10-CM | POA: Diagnosis not present

## 2021-01-21 DIAGNOSIS — G301 Alzheimer's disease with late onset: Secondary | ICD-10-CM | POA: Diagnosis not present

## 2021-01-21 DIAGNOSIS — Z9181 History of falling: Secondary | ICD-10-CM | POA: Diagnosis not present

## 2021-01-21 DIAGNOSIS — Z7982 Long term (current) use of aspirin: Secondary | ICD-10-CM | POA: Diagnosis not present

## 2021-01-21 DIAGNOSIS — I2510 Atherosclerotic heart disease of native coronary artery without angina pectoris: Secondary | ICD-10-CM | POA: Diagnosis not present

## 2021-01-21 DIAGNOSIS — R600 Localized edema: Secondary | ICD-10-CM | POA: Diagnosis not present

## 2021-01-23 DIAGNOSIS — Z951 Presence of aortocoronary bypass graft: Secondary | ICD-10-CM | POA: Diagnosis not present

## 2021-01-23 DIAGNOSIS — M6281 Muscle weakness (generalized): Secondary | ICD-10-CM | POA: Diagnosis not present

## 2021-01-23 DIAGNOSIS — E039 Hypothyroidism, unspecified: Secondary | ICD-10-CM | POA: Diagnosis not present

## 2021-01-23 DIAGNOSIS — Z7982 Long term (current) use of aspirin: Secondary | ICD-10-CM | POA: Diagnosis not present

## 2021-01-23 DIAGNOSIS — R600 Localized edema: Secondary | ICD-10-CM | POA: Diagnosis not present

## 2021-01-23 DIAGNOSIS — G301 Alzheimer's disease with late onset: Secondary | ICD-10-CM | POA: Diagnosis not present

## 2021-01-23 DIAGNOSIS — I2510 Atherosclerotic heart disease of native coronary artery without angina pectoris: Secondary | ICD-10-CM | POA: Diagnosis not present

## 2021-01-23 DIAGNOSIS — E559 Vitamin D deficiency, unspecified: Secondary | ICD-10-CM | POA: Diagnosis not present

## 2021-01-23 DIAGNOSIS — E785 Hyperlipidemia, unspecified: Secondary | ICD-10-CM | POA: Diagnosis not present

## 2021-01-23 DIAGNOSIS — R269 Unspecified abnormalities of gait and mobility: Secondary | ICD-10-CM | POA: Diagnosis not present

## 2021-01-23 DIAGNOSIS — R2689 Other abnormalities of gait and mobility: Secondary | ICD-10-CM | POA: Diagnosis not present

## 2021-01-23 DIAGNOSIS — Z9181 History of falling: Secondary | ICD-10-CM | POA: Diagnosis not present

## 2021-01-23 DIAGNOSIS — R69 Illness, unspecified: Secondary | ICD-10-CM | POA: Diagnosis not present

## 2021-01-25 DIAGNOSIS — Z9181 History of falling: Secondary | ICD-10-CM | POA: Diagnosis not present

## 2021-01-25 DIAGNOSIS — G301 Alzheimer's disease with late onset: Secondary | ICD-10-CM | POA: Diagnosis not present

## 2021-01-25 DIAGNOSIS — Z7982 Long term (current) use of aspirin: Secondary | ICD-10-CM | POA: Diagnosis not present

## 2021-01-25 DIAGNOSIS — R600 Localized edema: Secondary | ICD-10-CM | POA: Diagnosis not present

## 2021-01-25 DIAGNOSIS — I2510 Atherosclerotic heart disease of native coronary artery without angina pectoris: Secondary | ICD-10-CM | POA: Diagnosis not present

## 2021-01-25 DIAGNOSIS — E559 Vitamin D deficiency, unspecified: Secondary | ICD-10-CM | POA: Diagnosis not present

## 2021-01-25 DIAGNOSIS — R69 Illness, unspecified: Secondary | ICD-10-CM | POA: Diagnosis not present

## 2021-01-25 DIAGNOSIS — E039 Hypothyroidism, unspecified: Secondary | ICD-10-CM | POA: Diagnosis not present

## 2021-01-25 DIAGNOSIS — E785 Hyperlipidemia, unspecified: Secondary | ICD-10-CM | POA: Diagnosis not present

## 2021-01-25 DIAGNOSIS — Z951 Presence of aortocoronary bypass graft: Secondary | ICD-10-CM | POA: Diagnosis not present

## 2021-01-27 DIAGNOSIS — I509 Heart failure, unspecified: Secondary | ICD-10-CM | POA: Diagnosis not present

## 2021-01-27 DIAGNOSIS — I10 Essential (primary) hypertension: Secondary | ICD-10-CM | POA: Diagnosis not present

## 2021-01-27 DIAGNOSIS — D649 Anemia, unspecified: Secondary | ICD-10-CM | POA: Diagnosis not present

## 2021-02-02 DIAGNOSIS — R69 Illness, unspecified: Secondary | ICD-10-CM | POA: Diagnosis not present

## 2021-02-02 DIAGNOSIS — R54 Age-related physical debility: Secondary | ICD-10-CM | POA: Diagnosis not present

## 2021-02-02 DIAGNOSIS — R635 Abnormal weight gain: Secondary | ICD-10-CM | POA: Diagnosis not present

## 2021-02-02 DIAGNOSIS — R600 Localized edema: Secondary | ICD-10-CM | POA: Diagnosis not present

## 2021-02-04 DIAGNOSIS — R600 Localized edema: Secondary | ICD-10-CM | POA: Diagnosis not present

## 2021-02-04 DIAGNOSIS — E039 Hypothyroidism, unspecified: Secondary | ICD-10-CM | POA: Diagnosis not present

## 2021-02-04 DIAGNOSIS — E785 Hyperlipidemia, unspecified: Secondary | ICD-10-CM | POA: Diagnosis not present

## 2021-02-04 DIAGNOSIS — Z7982 Long term (current) use of aspirin: Secondary | ICD-10-CM | POA: Diagnosis not present

## 2021-02-04 DIAGNOSIS — Z951 Presence of aortocoronary bypass graft: Secondary | ICD-10-CM | POA: Diagnosis not present

## 2021-02-04 DIAGNOSIS — R69 Illness, unspecified: Secondary | ICD-10-CM | POA: Diagnosis not present

## 2021-02-04 DIAGNOSIS — G301 Alzheimer's disease with late onset: Secondary | ICD-10-CM | POA: Diagnosis not present

## 2021-02-04 DIAGNOSIS — Z9181 History of falling: Secondary | ICD-10-CM | POA: Diagnosis not present

## 2021-02-04 DIAGNOSIS — E559 Vitamin D deficiency, unspecified: Secondary | ICD-10-CM | POA: Diagnosis not present

## 2021-02-04 DIAGNOSIS — I2510 Atherosclerotic heart disease of native coronary artery without angina pectoris: Secondary | ICD-10-CM | POA: Diagnosis not present

## 2021-02-10 DIAGNOSIS — B351 Tinea unguium: Secondary | ICD-10-CM | POA: Diagnosis not present

## 2021-02-10 DIAGNOSIS — L609 Nail disorder, unspecified: Secondary | ICD-10-CM | POA: Diagnosis not present

## 2021-02-12 DIAGNOSIS — E559 Vitamin D deficiency, unspecified: Secondary | ICD-10-CM | POA: Diagnosis not present

## 2021-02-12 DIAGNOSIS — Z9181 History of falling: Secondary | ICD-10-CM | POA: Diagnosis not present

## 2021-02-12 DIAGNOSIS — E039 Hypothyroidism, unspecified: Secondary | ICD-10-CM | POA: Diagnosis not present

## 2021-02-12 DIAGNOSIS — R69 Illness, unspecified: Secondary | ICD-10-CM | POA: Diagnosis not present

## 2021-02-12 DIAGNOSIS — Z7982 Long term (current) use of aspirin: Secondary | ICD-10-CM | POA: Diagnosis not present

## 2021-02-12 DIAGNOSIS — Z951 Presence of aortocoronary bypass graft: Secondary | ICD-10-CM | POA: Diagnosis not present

## 2021-02-12 DIAGNOSIS — R600 Localized edema: Secondary | ICD-10-CM | POA: Diagnosis not present

## 2021-02-12 DIAGNOSIS — E785 Hyperlipidemia, unspecified: Secondary | ICD-10-CM | POA: Diagnosis not present

## 2021-02-12 DIAGNOSIS — I2510 Atherosclerotic heart disease of native coronary artery without angina pectoris: Secondary | ICD-10-CM | POA: Diagnosis not present

## 2021-02-12 DIAGNOSIS — G301 Alzheimer's disease with late onset: Secondary | ICD-10-CM | POA: Diagnosis not present

## 2021-02-16 DIAGNOSIS — R69 Illness, unspecified: Secondary | ICD-10-CM | POA: Diagnosis not present

## 2021-02-16 DIAGNOSIS — R634 Abnormal weight loss: Secondary | ICD-10-CM | POA: Diagnosis not present

## 2021-02-16 DIAGNOSIS — R600 Localized edema: Secondary | ICD-10-CM | POA: Diagnosis not present

## 2021-02-16 DIAGNOSIS — Z20828 Contact with and (suspected) exposure to other viral communicable diseases: Secondary | ICD-10-CM | POA: Diagnosis not present

## 2021-02-16 DIAGNOSIS — R54 Age-related physical debility: Secondary | ICD-10-CM | POA: Diagnosis not present

## 2021-02-26 DIAGNOSIS — M6281 Muscle weakness (generalized): Secondary | ICD-10-CM | POA: Diagnosis not present

## 2021-02-26 DIAGNOSIS — R269 Unspecified abnormalities of gait and mobility: Secondary | ICD-10-CM | POA: Diagnosis not present

## 2021-02-26 DIAGNOSIS — R2689 Other abnormalities of gait and mobility: Secondary | ICD-10-CM | POA: Diagnosis not present

## 2021-02-26 DIAGNOSIS — R54 Age-related physical debility: Secondary | ICD-10-CM | POA: Diagnosis not present

## 2021-02-27 DIAGNOSIS — R54 Age-related physical debility: Secondary | ICD-10-CM | POA: Diagnosis not present

## 2021-02-27 DIAGNOSIS — R2689 Other abnormalities of gait and mobility: Secondary | ICD-10-CM | POA: Diagnosis not present

## 2021-02-27 DIAGNOSIS — M6281 Muscle weakness (generalized): Secondary | ICD-10-CM | POA: Diagnosis not present

## 2021-02-27 DIAGNOSIS — R269 Unspecified abnormalities of gait and mobility: Secondary | ICD-10-CM | POA: Diagnosis not present

## 2021-03-31 DIAGNOSIS — Z20828 Contact with and (suspected) exposure to other viral communicable diseases: Secondary | ICD-10-CM | POA: Diagnosis not present

## 2021-04-01 DIAGNOSIS — R69 Illness, unspecified: Secondary | ICD-10-CM | POA: Diagnosis not present

## 2021-04-01 DIAGNOSIS — R54 Age-related physical debility: Secondary | ICD-10-CM | POA: Diagnosis not present

## 2021-04-01 DIAGNOSIS — R059 Cough, unspecified: Secondary | ICD-10-CM | POA: Diagnosis not present

## 2021-04-01 DIAGNOSIS — R1310 Dysphagia, unspecified: Secondary | ICD-10-CM | POA: Diagnosis not present

## 2021-04-08 DIAGNOSIS — R2689 Other abnormalities of gait and mobility: Secondary | ICD-10-CM | POA: Diagnosis not present

## 2021-04-08 DIAGNOSIS — R269 Unspecified abnormalities of gait and mobility: Secondary | ICD-10-CM | POA: Diagnosis not present

## 2021-04-08 DIAGNOSIS — M6281 Muscle weakness (generalized): Secondary | ICD-10-CM | POA: Diagnosis not present

## 2021-04-08 DIAGNOSIS — R54 Age-related physical debility: Secondary | ICD-10-CM | POA: Diagnosis not present

## 2021-04-10 DIAGNOSIS — D649 Anemia, unspecified: Secondary | ICD-10-CM | POA: Diagnosis not present

## 2021-04-10 DIAGNOSIS — I10 Essential (primary) hypertension: Secondary | ICD-10-CM | POA: Diagnosis not present

## 2021-04-10 DIAGNOSIS — E039 Hypothyroidism, unspecified: Secondary | ICD-10-CM | POA: Diagnosis not present

## 2021-04-10 DIAGNOSIS — I2510 Atherosclerotic heart disease of native coronary artery without angina pectoris: Secondary | ICD-10-CM | POA: Diagnosis not present

## 2021-04-14 DIAGNOSIS — D649 Anemia, unspecified: Secondary | ICD-10-CM | POA: Diagnosis not present

## 2021-04-14 DIAGNOSIS — E039 Hypothyroidism, unspecified: Secondary | ICD-10-CM | POA: Diagnosis not present

## 2021-04-14 DIAGNOSIS — I10 Essential (primary) hypertension: Secondary | ICD-10-CM | POA: Diagnosis not present

## 2021-04-14 DIAGNOSIS — R69 Illness, unspecified: Secondary | ICD-10-CM | POA: Diagnosis not present

## 2021-04-16 DIAGNOSIS — D649 Anemia, unspecified: Secondary | ICD-10-CM | POA: Diagnosis not present

## 2021-04-16 DIAGNOSIS — R799 Abnormal finding of blood chemistry, unspecified: Secondary | ICD-10-CM | POA: Diagnosis not present

## 2021-04-16 DIAGNOSIS — E039 Hypothyroidism, unspecified: Secondary | ICD-10-CM | POA: Diagnosis not present

## 2021-04-16 DIAGNOSIS — E870 Hyperosmolality and hypernatremia: Secondary | ICD-10-CM | POA: Diagnosis not present

## 2021-05-06 DIAGNOSIS — R54 Age-related physical debility: Secondary | ICD-10-CM | POA: Diagnosis not present

## 2021-05-06 DIAGNOSIS — R1310 Dysphagia, unspecified: Secondary | ICD-10-CM | POA: Diagnosis not present

## 2021-05-06 DIAGNOSIS — R69 Illness, unspecified: Secondary | ICD-10-CM | POA: Diagnosis not present

## 2021-05-15 DIAGNOSIS — L609 Nail disorder, unspecified: Secondary | ICD-10-CM | POA: Diagnosis not present

## 2021-05-15 DIAGNOSIS — B351 Tinea unguium: Secondary | ICD-10-CM | POA: Diagnosis not present

## 2021-06-11 DIAGNOSIS — I10 Essential (primary) hypertension: Secondary | ICD-10-CM | POA: Diagnosis not present

## 2021-06-11 DIAGNOSIS — D649 Anemia, unspecified: Secondary | ICD-10-CM | POA: Diagnosis not present

## 2021-06-11 DIAGNOSIS — E039 Hypothyroidism, unspecified: Secondary | ICD-10-CM | POA: Diagnosis not present

## 2021-06-16 DIAGNOSIS — E039 Hypothyroidism, unspecified: Secondary | ICD-10-CM | POA: Diagnosis not present

## 2021-06-16 DIAGNOSIS — R69 Illness, unspecified: Secondary | ICD-10-CM | POA: Diagnosis not present

## 2021-06-16 DIAGNOSIS — R799 Abnormal finding of blood chemistry, unspecified: Secondary | ICD-10-CM | POA: Diagnosis not present

## 2021-07-02 DIAGNOSIS — L249 Irritant contact dermatitis, unspecified cause: Secondary | ICD-10-CM | POA: Diagnosis not present

## 2021-07-02 DIAGNOSIS — R54 Age-related physical debility: Secondary | ICD-10-CM | POA: Diagnosis not present

## 2021-07-02 DIAGNOSIS — R69 Illness, unspecified: Secondary | ICD-10-CM | POA: Diagnosis not present

## 2021-07-02 DIAGNOSIS — R32 Unspecified urinary incontinence: Secondary | ICD-10-CM | POA: Diagnosis not present

## 2021-07-15 DIAGNOSIS — Z23 Encounter for immunization: Secondary | ICD-10-CM | POA: Diagnosis not present

## 2021-07-23 DIAGNOSIS — R54 Age-related physical debility: Secondary | ICD-10-CM | POA: Diagnosis not present

## 2021-07-23 DIAGNOSIS — S40211A Abrasion of right shoulder, initial encounter: Secondary | ICD-10-CM | POA: Diagnosis not present

## 2021-07-23 DIAGNOSIS — S40221A Blister (nonthermal) of right shoulder, initial encounter: Secondary | ICD-10-CM | POA: Diagnosis not present

## 2021-07-23 DIAGNOSIS — L299 Pruritus, unspecified: Secondary | ICD-10-CM | POA: Diagnosis not present

## 2021-08-05 DIAGNOSIS — Z5181 Encounter for therapeutic drug level monitoring: Secondary | ICD-10-CM | POA: Diagnosis not present

## 2021-08-05 DIAGNOSIS — R54 Age-related physical debility: Secondary | ICD-10-CM | POA: Diagnosis not present

## 2021-08-05 DIAGNOSIS — R69 Illness, unspecified: Secondary | ICD-10-CM | POA: Diagnosis not present

## 2021-08-10 DIAGNOSIS — E039 Hypothyroidism, unspecified: Secondary | ICD-10-CM | POA: Diagnosis not present

## 2021-08-12 DIAGNOSIS — L299 Pruritus, unspecified: Secondary | ICD-10-CM | POA: Diagnosis not present

## 2021-08-12 DIAGNOSIS — L219 Seborrheic dermatitis, unspecified: Secondary | ICD-10-CM | POA: Diagnosis not present

## 2021-08-12 DIAGNOSIS — R69 Illness, unspecified: Secondary | ICD-10-CM | POA: Diagnosis not present

## 2021-08-12 DIAGNOSIS — R54 Age-related physical debility: Secondary | ICD-10-CM | POA: Diagnosis not present

## 2021-08-14 DIAGNOSIS — R69 Illness, unspecified: Secondary | ICD-10-CM | POA: Diagnosis not present

## 2021-08-14 DIAGNOSIS — E039 Hypothyroidism, unspecified: Secondary | ICD-10-CM | POA: Diagnosis not present

## 2021-08-14 DIAGNOSIS — R54 Age-related physical debility: Secondary | ICD-10-CM | POA: Diagnosis not present

## 2021-08-17 DIAGNOSIS — R69 Illness, unspecified: Secondary | ICD-10-CM | POA: Diagnosis not present

## 2021-08-17 DIAGNOSIS — R54 Age-related physical debility: Secondary | ICD-10-CM | POA: Diagnosis not present

## 2021-08-17 DIAGNOSIS — L989 Disorder of the skin and subcutaneous tissue, unspecified: Secondary | ICD-10-CM | POA: Diagnosis not present

## 2021-08-26 DIAGNOSIS — L0390 Cellulitis, unspecified: Secondary | ICD-10-CM | POA: Diagnosis not present

## 2021-08-26 DIAGNOSIS — R1310 Dysphagia, unspecified: Secondary | ICD-10-CM | POA: Diagnosis not present

## 2021-08-26 DIAGNOSIS — R54 Age-related physical debility: Secondary | ICD-10-CM | POA: Diagnosis not present

## 2021-08-26 DIAGNOSIS — L989 Disorder of the skin and subcutaneous tissue, unspecified: Secondary | ICD-10-CM | POA: Diagnosis not present

## 2021-08-31 DIAGNOSIS — J3489 Other specified disorders of nose and nasal sinuses: Secondary | ICD-10-CM | POA: Diagnosis not present

## 2021-08-31 DIAGNOSIS — R54 Age-related physical debility: Secondary | ICD-10-CM | POA: Diagnosis not present

## 2021-09-03 DIAGNOSIS — R69 Illness, unspecified: Secondary | ICD-10-CM | POA: Diagnosis not present

## 2021-09-03 DIAGNOSIS — L989 Disorder of the skin and subcutaneous tissue, unspecified: Secondary | ICD-10-CM | POA: Diagnosis not present

## 2021-09-03 DIAGNOSIS — Z85828 Personal history of other malignant neoplasm of skin: Secondary | ICD-10-CM | POA: Diagnosis not present

## 2021-09-04 DIAGNOSIS — R69 Illness, unspecified: Secondary | ICD-10-CM | POA: Diagnosis not present

## 2021-09-04 DIAGNOSIS — Z85828 Personal history of other malignant neoplasm of skin: Secondary | ICD-10-CM | POA: Diagnosis not present

## 2021-09-04 DIAGNOSIS — L989 Disorder of the skin and subcutaneous tissue, unspecified: Secondary | ICD-10-CM | POA: Diagnosis not present

## 2021-09-04 DIAGNOSIS — L03319 Cellulitis of trunk, unspecified: Secondary | ICD-10-CM | POA: Diagnosis not present

## 2021-09-07 DIAGNOSIS — I2510 Atherosclerotic heart disease of native coronary artery without angina pectoris: Secondary | ICD-10-CM | POA: Diagnosis not present

## 2021-09-07 DIAGNOSIS — R69 Illness, unspecified: Secondary | ICD-10-CM | POA: Diagnosis not present

## 2021-09-07 DIAGNOSIS — L03113 Cellulitis of right upper limb: Secondary | ICD-10-CM | POA: Diagnosis not present

## 2021-09-07 DIAGNOSIS — E559 Vitamin D deficiency, unspecified: Secondary | ICD-10-CM | POA: Diagnosis not present

## 2021-09-07 DIAGNOSIS — L989 Disorder of the skin and subcutaneous tissue, unspecified: Secondary | ICD-10-CM | POA: Diagnosis not present

## 2021-09-07 DIAGNOSIS — E039 Hypothyroidism, unspecified: Secondary | ICD-10-CM | POA: Diagnosis not present

## 2021-09-07 DIAGNOSIS — E785 Hyperlipidemia, unspecified: Secondary | ICD-10-CM | POA: Diagnosis not present

## 2021-09-07 DIAGNOSIS — L03319 Cellulitis of trunk, unspecified: Secondary | ICD-10-CM | POA: Diagnosis not present

## 2021-09-07 DIAGNOSIS — G309 Alzheimer's disease, unspecified: Secondary | ICD-10-CM | POA: Diagnosis not present

## 2021-09-15 DIAGNOSIS — L0100 Impetigo, unspecified: Secondary | ICD-10-CM | POA: Diagnosis not present

## 2021-09-21 DIAGNOSIS — R69 Illness, unspecified: Secondary | ICD-10-CM | POA: Diagnosis not present

## 2021-09-21 DIAGNOSIS — K5900 Constipation, unspecified: Secondary | ICD-10-CM | POA: Diagnosis not present

## 2021-09-25 DIAGNOSIS — L989 Disorder of the skin and subcutaneous tissue, unspecified: Secondary | ICD-10-CM | POA: Diagnosis not present

## 2021-09-25 DIAGNOSIS — R69 Illness, unspecified: Secondary | ICD-10-CM | POA: Diagnosis not present

## 2021-09-25 DIAGNOSIS — R4181 Age-related cognitive decline: Secondary | ICD-10-CM | POA: Diagnosis not present

## 2021-09-25 DIAGNOSIS — R634 Abnormal weight loss: Secondary | ICD-10-CM | POA: Diagnosis not present

## 2021-09-26 DIAGNOSIS — K3189 Other diseases of stomach and duodenum: Secondary | ICD-10-CM | POA: Diagnosis not present

## 2021-09-26 DIAGNOSIS — I6782 Cerebral ischemia: Secondary | ICD-10-CM | POA: Diagnosis not present

## 2021-09-26 DIAGNOSIS — R069 Unspecified abnormalities of breathing: Secondary | ICD-10-CM | POA: Diagnosis not present

## 2021-09-26 DIAGNOSIS — R339 Retention of urine, unspecified: Secondary | ICD-10-CM | POA: Diagnosis not present

## 2021-09-26 DIAGNOSIS — E871 Hypo-osmolality and hyponatremia: Secondary | ICD-10-CM | POA: Diagnosis not present

## 2021-09-26 DIAGNOSIS — I700 Atherosclerosis of aorta: Secondary | ICD-10-CM | POA: Diagnosis not present

## 2021-09-26 DIAGNOSIS — Z743 Need for continuous supervision: Secondary | ICD-10-CM | POA: Diagnosis not present

## 2021-09-26 DIAGNOSIS — I120 Hypertensive chronic kidney disease with stage 5 chronic kidney disease or end stage renal disease: Secondary | ICD-10-CM | POA: Diagnosis not present

## 2021-09-26 DIAGNOSIS — R918 Other nonspecific abnormal finding of lung field: Secondary | ICD-10-CM | POA: Diagnosis not present

## 2021-09-26 DIAGNOSIS — N179 Acute kidney failure, unspecified: Secondary | ICD-10-CM | POA: Diagnosis not present

## 2021-09-26 DIAGNOSIS — R4182 Altered mental status, unspecified: Secondary | ICD-10-CM | POA: Diagnosis not present

## 2021-09-26 DIAGNOSIS — K6389 Other specified diseases of intestine: Secondary | ICD-10-CM | POA: Diagnosis not present

## 2021-09-26 DIAGNOSIS — E860 Dehydration: Secondary | ICD-10-CM | POA: Diagnosis not present

## 2021-09-26 DIAGNOSIS — N471 Phimosis: Secondary | ICD-10-CM | POA: Diagnosis not present

## 2021-09-26 DIAGNOSIS — R4020 Unspecified coma: Secondary | ICD-10-CM | POA: Diagnosis not present

## 2021-09-26 DIAGNOSIS — R404 Transient alteration of awareness: Secondary | ICD-10-CM | POA: Diagnosis not present

## 2021-09-26 DIAGNOSIS — I2510 Atherosclerotic heart disease of native coronary artery without angina pectoris: Secondary | ICD-10-CM | POA: Diagnosis not present

## 2021-09-26 DIAGNOSIS — K6289 Other specified diseases of anus and rectum: Secondary | ICD-10-CM | POA: Diagnosis not present

## 2021-09-26 DIAGNOSIS — K5730 Diverticulosis of large intestine without perforation or abscess without bleeding: Secondary | ICD-10-CM | POA: Diagnosis not present

## 2021-09-28 DIAGNOSIS — Z9981 Dependence on supplemental oxygen: Secondary | ICD-10-CM | POA: Diagnosis not present

## 2021-09-28 DIAGNOSIS — J690 Pneumonitis due to inhalation of food and vomit: Secondary | ICD-10-CM | POA: Diagnosis not present

## 2021-09-29 DIAGNOSIS — Z9981 Dependence on supplemental oxygen: Secondary | ICD-10-CM | POA: Diagnosis not present

## 2021-09-29 DIAGNOSIS — I517 Cardiomegaly: Secondary | ICD-10-CM | POA: Diagnosis not present

## 2021-09-29 DIAGNOSIS — J690 Pneumonitis due to inhalation of food and vomit: Secondary | ICD-10-CM | POA: Diagnosis not present

## 2021-09-30 DIAGNOSIS — J690 Pneumonitis due to inhalation of food and vomit: Secondary | ICD-10-CM | POA: Diagnosis not present

## 2021-09-30 DIAGNOSIS — R0600 Dyspnea, unspecified: Secondary | ICD-10-CM | POA: Diagnosis not present

## 2021-09-30 DIAGNOSIS — Z9981 Dependence on supplemental oxygen: Secondary | ICD-10-CM | POA: Diagnosis not present

## 2021-10-25 DEATH — deceased
